# Patient Record
Sex: Female | Born: 1985 | Race: White | Hispanic: No | Marital: Married | Smoking: Former smoker
Health system: Southern US, Community
[De-identification: ages and names within clinical notes are randomized; demographics above are authoritative.]

## PROBLEM LIST (undated history)

## (undated) DIAGNOSIS — M1990 Unspecified osteoarthritis, unspecified site: Secondary | ICD-10-CM

## (undated) DIAGNOSIS — Z8669 Personal history of other diseases of the nervous system and sense organs: Secondary | ICD-10-CM

## (undated) DIAGNOSIS — Z8619 Personal history of other infectious and parasitic diseases: Secondary | ICD-10-CM

## (undated) DIAGNOSIS — R002 Palpitations: Secondary | ICD-10-CM

## (undated) DIAGNOSIS — T7840XA Allergy, unspecified, initial encounter: Secondary | ICD-10-CM

## (undated) DIAGNOSIS — K219 Gastro-esophageal reflux disease without esophagitis: Secondary | ICD-10-CM

## (undated) HISTORY — DX: Allergy, unspecified, initial encounter: T78.40XA

## (undated) HISTORY — DX: Unspecified osteoarthritis, unspecified site: M19.90

## (undated) HISTORY — DX: Gastro-esophageal reflux disease without esophagitis: K21.9

## (undated) HISTORY — DX: Palpitations: R00.2

## (undated) HISTORY — PX: TONSILLECTOMY: SUR1361

## (undated) HISTORY — DX: Personal history of other infectious and parasitic diseases: Z86.19

## (undated) HISTORY — DX: Personal history of other diseases of the nervous system and sense organs: Z86.69

---

## 2013-05-13 LAB — HM HIV SCREENING LAB: HM HIV Screening: NEGATIVE

## 2013-05-13 LAB — HIV ANTIBODY (ROUTINE TESTING W REFLEX): HIV 1&2 Ab, 4th Generation: NEGATIVE

## 2017-08-13 ENCOUNTER — Ambulatory Visit: Payer: BLUE CROSS/BLUE SHIELD | Admitting: Internal Medicine

## 2017-08-13 VITALS — BP 92/66 | HR 70 | Temp 98.0°F | Ht 64.0 in | Wt 128.8 lb

## 2017-08-13 DIAGNOSIS — Z1159 Encounter for screening for other viral diseases: Secondary | ICD-10-CM | POA: Diagnosis not present

## 2017-08-13 DIAGNOSIS — Z0184 Encounter for antibody response examination: Secondary | ICD-10-CM | POA: Diagnosis not present

## 2017-08-13 DIAGNOSIS — Z1329 Encounter for screening for other suspected endocrine disorder: Secondary | ICD-10-CM

## 2017-08-13 DIAGNOSIS — E559 Vitamin D deficiency, unspecified: Secondary | ICD-10-CM

## 2017-08-13 DIAGNOSIS — Z808 Family history of malignant neoplasm of other organs or systems: Secondary | ICD-10-CM | POA: Diagnosis not present

## 2017-08-13 DIAGNOSIS — Z1389 Encounter for screening for other disorder: Secondary | ICD-10-CM | POA: Diagnosis not present

## 2017-08-13 DIAGNOSIS — Z13220 Encounter for screening for lipoid disorders: Secondary | ICD-10-CM

## 2017-08-13 DIAGNOSIS — G43919 Migraine, unspecified, intractable, without status migrainosus: Secondary | ICD-10-CM | POA: Diagnosis not present

## 2017-08-13 DIAGNOSIS — Z0000 Encounter for general adult medical examination without abnormal findings: Secondary | ICD-10-CM | POA: Diagnosis not present

## 2017-08-13 DIAGNOSIS — Z1283 Encounter for screening for malignant neoplasm of skin: Secondary | ICD-10-CM | POA: Diagnosis not present

## 2017-08-13 DIAGNOSIS — J309 Allergic rhinitis, unspecified: Secondary | ICD-10-CM

## 2017-08-13 DIAGNOSIS — G43909 Migraine, unspecified, not intractable, without status migrainosus: Secondary | ICD-10-CM

## 2017-08-13 MED ORDER — BUTALBITAL-APAP-CAFFEINE 50-300-40 MG PO CAPS: 1.0000 | ORAL_CAPSULE | ORAL | 3 refills | Status: DC | PRN

## 2017-08-13 MED ORDER — PROMETHAZINE HCL 25 MG PO TABS: 25.0000 mg | ORAL_TABLET | Freq: Two times a day (BID) | ORAL | 2 refills | Status: DC | PRN

## 2017-08-13 MED ORDER — SUMATRIPTAN SUCCINATE 50 MG PO TABS: 50.0000 mg | ORAL_TABLET | ORAL | 2 refills | Status: DC | PRN

## 2017-08-13 NOTE — Progress Notes (Addendum)
Chief Complaint  Patient presents with  . New Patient (Initial Visit)   NP former Dr. Lorin PicketScott pt years ago  1. C/o migraines drinking 1-2 cups of coffee per day and soda prn sleeping 8 hours on migraine medications which help at times but 2 weeks ago had vomiting in middle of night 2/2 h/a and had to go to ED/UC 2 imitrex did not help at that time had toradol and phenerghan at the clinic. This has happened I.e bad h/a 2x w/in the last 3 years.  2. FH skin cancer would like to see dermatology  3. H/o palpitations though no longer having them and never saw cardiology in the past.    Review of Systems  Constitutional: Negative for weight loss.  HENT: Negative for hearing loss.   Eyes: Negative for blurred vision.  Respiratory: Negative for shortness of breath.   Cardiovascular: Negative for chest pain and palpitations.  Gastrointestinal: Negative for abdominal pain.  Musculoskeletal: Negative for falls.  Skin: Negative for rash.  Neurological: Negative for headaches.  Psychiatric/Behavioral: Negative for depression.   Past Medical History:  Diagnosis Date  . Allergy   . Arthritis   . GERD (gastroesophageal reflux disease)   . Heart palpitations    years ago thought 2/2 stress did not see cardiology was on amitriptyline per pt   . History of chicken pox   . Hx of migraines    Past Surgical History:  Procedure Laterality Date  . TONSILLECTOMY     2001   Family History  Problem Relation Age of Onset  . Depression Mother   . Early death Mother        1561  . Heart disease Mother        MVP s/p repair and valve infected   . Learning disabilities Mother   . Stroke Mother   . Cancer Mother        skin  . Alcohol abuse Father   . Hyperlipidemia Father   . Hypertension Father   . Learning disabilities Father   . Learning disabilities Sister   . Depression Sister   . Cancer Maternal Aunt        skin  . Cancer Maternal Uncle        skin  . COPD Maternal Grandmother   .  Diabetes Maternal Grandmother   . Early death Maternal Grandfather   . Heart disease Maternal Grandfather        MI age 34   . Alcohol abuse Paternal Grandmother   . Cancer Paternal Grandmother        throat   . COPD Paternal Grandmother   . Early death Paternal Grandmother   . Alcohol abuse Paternal Grandfather   . COPD Paternal Grandfather    Social History   Socioeconomic History  . Marital status: Married    Spouse name: Not on file  . Number of children: Not on file  . Years of education: Not on file  . Highest education level: Not on file  Occupational History  . Not on file  Social Needs  . Financial resource strain: Not on file  . Food insecurity:    Worry: Not on file    Inability: Not on file  . Transportation needs:    Medical: Not on file    Non-medical: Not on file  Tobacco Use  . Smoking status: Former Games developermoker  . Smokeless tobacco: Never Used  . Tobacco comment: smoked x 10 years on and off 5 cig per  day   Substance and Sexual Activity  . Alcohol use: Yes    Comment: rare   . Drug use: Not Currently  . Sexual activity: Yes  Lifestyle  . Physical activity:    Days per week: Not on file    Minutes per session: Not on file  . Stress: Not on file  Relationships  . Social connections:    Talks on phone: Not on file    Gets together: Not on file    Attends religious service: Not on file    Active member of club or organization: Not on file    Attends meetings of clubs or organizations: Not on file    Relationship status: Not on file  . Intimate partner violence:    Fear of current or ex partner: Not on file    Emotionally abused: Not on file    Physically abused: Not on file    Forced sexual activity: Not on file  Other Topics Concern  . 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 Butalbital-APAP-Caffeine  (FIORICET) 50-300-40 MG CAPS Take 1-2 tablets by mouth every 4 (four) hours as needed.  Marland Kitchen levonorgestrel (MIRENA) 20 MCG/24HR IUD 1 each by Intrauterine route once.  . SUMAtriptan (IMITREX) 50 MG tablet Take 1 tablet (50 mg total) by mouth every 2 (two) hours as needed for migraine. May repeat in 2 hours if headache persists or recurs. No more than 2x per week or 200 mg in 24 hours  . [DISCONTINUED] Butalbital-APAP-Caffeine (FIORICET) 50-300-40 MG CAPS Take by mouth.  . [DISCONTINUED] Promethazine HCl (PHENERGAN PO) Take by mouth.  . [DISCONTINUED] SUMAtriptan (IMITREX) 50 MG tablet   . [DISCONTINUED] SUMAtriptan (IMITREX) 50 MG tablet Take 50 mg by mouth every 2 (two) hours as needed for migraine. May repeat in 2 hours if headache persists or recurs.   Allergies  Allergen Reactions  . Other     Dogs, cats, dust mites and mold     No results found for this or any previous visit (from the past 2160 hour(s)). Objective  Body mass index is 22.11 kg/m. Wt Readings from Last 3 Encounters:  08/13/17 128 lb 12.8 oz (58.4 kg)   Temp Readings from Last 3 Encounters:  08/13/17 98 F (36.7 C) (Oral)   BP Readings from Last 3 Encounters:  08/13/17 92/66   Pulse Readings from Last 3 Encounters:  08/13/17 70    Physical Exam  Constitutional: She is oriented to person, place, and time. Vital signs are normal. She appears well-developed and well-nourished. She is cooperative.  HENT:  Head: Normocephalic and atraumatic.  Mouth/Throat: Oropharynx is clear and moist and mucous membranes are normal.  Eyes: Pupils are equal, round, and reactive to light. Conjunctivae are normal.  Cardiovascular: Normal rate, regular rhythm and normal heart sounds.  Pulmonary/Chest: Effort normal and breath sounds normal.  Neurological: She is alert and oriented to person, place, and time. Gait normal.  Skin: Skin is warm, dry and intact.  Psychiatric: She has a normal mood and affect. Her speech is normal and  behavior is normal. Judgment and thought content normal. Cognition and memory are normal.  Nursing note and vitals reviewed.   Assessment   1. Migraine  Saw Dr. Sherryll Burger 11/10/17 start mag 400-600 mg qd  2. FH skin cancer (mom, uncle and aunt MM) 3. Allergic rhinitis/allergies  4. HM Plan  1. Refilled meds  Referred to neurology Dr. Sherryll Burger further f/u  -rec mag, fioricet 50-300-40 prn, imitrex 50-100 mg rec CMET, CBC, TSH, B12 and vitamin D with PCP rec melatonin 3 mg qhs for insomnia f/ui in 6 months   2. Referred to dermatology Dr. Roseanne Kaufman for tbse  3. rec use Zyrtec, flonase otc due to ears popping consider singulair in future used to be on allergy shots hold for now allergic to dogs, cats, dust mites and mold  4.  Last flu shot 2017  Tdap per pt 2010 though not in [facility]   resch fasting labs  HIV neg 05/13/13   Former smoker  Pap get records [facility]  Has Mirena IUD   Eye MD [facility]     Provider: Dr. French Ana McLean-Scocuzza-[facility]

## 2017-08-13 NOTE — Progress Notes (Signed)
Pre visit review using our clinic review tool, if applicable. No additional management support is needed unless otherwise documented below in the visit note. 

## 2017-08-13 NOTE — Patient Instructions (Addendum)
Consider singulair for allergies  F/u in 3 months  sch fasting labs as soon as possible  Try Flonase and Zyrtec     Migraine Headache A migraine headache is an intense, throbbing pain on one side or both sides of the head. Migraines may also cause other symptoms, such as nausea, vomiting, and sensitivity to light and noise. What are the causes? Doing or taking certain things may also trigger migraines, such as:  Alcohol.  Smoking.  Medicines, such as: ? Medicine used to treat chest pain (nitroglycerine). ? Birth control pills. ? Estrogen pills. ? Certain blood pressure medicines.  Aged cheeses, chocolate, or caffeine.  Foods or drinks that contain nitrates, glutamate, aspartame, or tyramine.  Physical activity.  Other things that may trigger a migraine include:  Menstruation.  Pregnancy.  Hunger.  Stress, lack of sleep, too much sleep, or fatigue.  Weather changes.  What increases the risk? The following factors may make you more likely to experience migraine headaches:  Age. Risk increases with age.  Family history of migraine headaches.  Being Caucasian.  Depression and anxiety.  Obesity.  Being a woman.  Having a hole in the heart (patent foramen ovale) or other heart problems.  What are the signs or symptoms? The main symptom of this condition is pulsating or throbbing pain. Pain may:  Happen in any area of the head, such as on one side or both sides.  Interfere with daily activities.  Get worse with physical activity.  Get worse with exposure to bright lights or loud noises.  Other symptoms may include:  Nausea.  Vomiting.  Dizziness.  General sensitivity to bright lights, loud noises, or smells.  Before you get a migraine, you may get warning signs that a migraine is developing (aura). An aura may include:  Seeing flashing lights or having blind spots.  Seeing bright spots, halos, or zigzag lines.  Having tunnel vision or  blurred vision.  Having numbness or a tingling feeling.  Having trouble talking.  Having muscle weakness.  How is this diagnosed? A migraine headache can be diagnosed based on:  Your symptoms.  A physical exam.  Tests, such as CT scan or MRI of the head. These imaging tests can help rule out other causes of headaches.  Taking fluid from the spine (lumbar puncture) and analyzing it (cerebrospinal fluid analysis, or CSF analysis).  How is this treated? A migraine headache is usually treated with medicines that:  Relieve pain.  Relieve nausea.  Prevent migraines from coming back.  Treatment may also include:  Acupuncture.  Lifestyle changes like avoiding foods that trigger migraines.  Follow these instructions at home: Medicines  Take over-the-counter and prescription medicines only as told by your health care provider.  Do not drive or use heavy machinery while taking prescription pain medicine.  To prevent or treat constipation while you are taking prescription pain medicine, your health care provider may recommend that you: ? Drink enough fluid to keep your urine clear or pale yellow. ? Take over-the-counter or prescription medicines. ? Eat foods that are high in fiber, such as fresh fruits and vegetables, whole grains, and beans. ? Limit foods that are high in fat and processed sugars, such as fried and sweet foods. Lifestyle  Avoid alcohol use.  Do not use any products that contain nicotine or tobacco, such as cigarettes and e-cigarettes. If you need help quitting, ask your health care provider.  Get at least 8 hours of sleep every night.  Limit your  stress. General instructions   Keep a journal to find out what may trigger your migraine headaches. For example, write down: ? What you eat and drink. ? How much sleep you get. ? Any change to your diet or medicines.  If you have a migraine: ? Avoid things that make your symptoms worse, such as bright  lights. ? It may help to lie down in a dark, quiet room. ? Do not drive or use heavy machinery. ? Ask your health care provider what activities are safe for you while you are experiencing symptoms.  Keep all follow-up visits as told by your health care provider. This is important. Contact a health care provider if:  You develop symptoms that are different or more severe than your usual migraine symptoms. Get help right away if:  Your migraine becomes severe.  You have a fever.  You have a stiff neck.  You have vision loss.  Your muscles feel weak or like you cannot control them.  You start to lose your balance often.  You develop trouble walking.  You faint. This information is not intended to replace advice given to you by your health care provider. Make sure you discuss any questions you have with your health care provider. Document Released: 02/05/2005 Document Revised: 08/26/2015 Document Reviewed: 07/25/2015 Elsevier Interactive Patient Education  2017 Elsevier Inc.    Allergies, Adult An allergy is when your body's defense system (immune system) overreacts to an otherwise harmless substance (allergen) that you breathe in or eat or something that touches your skin. When you come into contact with something that you are allergic to, your immune system produces certain proteins (antibodies). These proteins cause cells to release chemicals (histamines) that trigger the symptoms of an allergic reaction. Allergies often affect the nasal passages (allergic rhinitis), eyes (allergic conjunctivitis), skin (atopic dermatitis), and stomach. Allergies can be mild or severe. Allergies cannot spread from person to person (are not contagious). They can develop at any age and may be outgrown. What increases the risk? You may be at greater risk of allergies if other people in your family have allergies. What are the signs or symptoms? Symptoms depend on what type of allergy you have. They  may include:  Runny, stuffy nose.  Sneezing.  Itchy mouth, ears, or throat.  Postnasal drip.  Sore throat.  Itchy, red, watery, or puffy eyes.  Skin rash or hives.  Stomach pain.  Vomiting.  Diarrhea.  Bloating.  Wheezing or coughing.  People with a severe allergy to food, medicine, or an insect bite may have a life-threatening allergic reaction (anaphylaxis). Symptoms of anaphylaxis include:  Hives.  Itching.  Flushed face.  Swollen lips, tongue, or mouth.  Tight or swollen throat.  Chest pain or tightness in the chest.  Trouble breathing or shortness of breath.  Rapid heartbeat.  Dizziness or fainting.  Vomiting.  Diarrhea.  Pain in the abdomen.  How is this diagnosed? This condition is diagnosed based on:  Your symptoms.  Your family and medical history.  A physical exam.  You may need to see a health care provider who specializes in treating allergies (allergist). You may also have tests, including:  Skin tests to see which allergens are causing your symptoms, such as: ? Skin prick test. In this test, your skin is pricked with a tiny needle and exposed to small amounts of possible allergens to see if your skin reacts. ? Intradermal skin test. In this test, a small amount of allergen is injected  under your skin to see if your skin reacts. ? Patch test. In this test, a small amount of allergen is placed on your skin and then your skin is covered with a bandage. Your health care provider will check your skin after a couple of days to see if a rash has developed.  Blood tests.  Challenges tests. In this test, you inhale a small amount of allergen by mouth to see if you have an allergic reaction.  You may also be asked to:  Keep a food diary. A food diary is a record of all the foods and drinks you have in a day and any symptoms you experience.  Practice an elimination diet. An elimination diet involves eliminating specific foods from your  diet and then adding them back in one by one to find out if a certain food causes an allergic reaction.  How is this treated? Treatment for allergies depends on your symptoms. Treatment may include:  Cold compresses to soothe itching and swelling.  Eye drops.  Nasal sprays.  Using a saline spray or container (neti pot) to flush out the nose (nasal irrigation). These methods can help clear away mucus and keep the nasal passages moist.  Using a humidifier.  Oral antihistamines or other medicines to block allergic reaction and inflammation.  Skin creams to treat rashes or itching.  Diet changes to eliminate food allergy triggers.  Repeated exposure to tiny amounts of allergens to build up a tolerance and prevent future allergic reactions (immunotherapy). These include: ? Allergy shots. ? Oral treatment. This involves taking small doses of an allergen under the tongue (sublingual immunotherapy).  Emergency epinephrine injection (auto-injector) in case of an allergic emergency. This is a self-injectable, pre-measured medicine that must be given within the first few minutes of a serious allergic reaction.  Follow these instructions at home:  Avoid known allergens whenever possible.  If you suffer from airborne allergens, wash out your nose daily. You can do this with a saline spray or a neti pot to flush out your nose (nasal irrigation).  Take over-the-counter and prescription medicines only as told by your health care provider.  Keep all follow-up visits as told by your health care provider. This is important.  If you are at risk of a severe allergic reaction (anaphylaxis), keep your auto-injector with you at all times.  If you have ever had anaphylaxis, wear a medical alert bracelet or necklace that states you have a severe allergy. Contact a health care provider if:  Your symptoms do not improve with treatment. Get help right away if:  You have symptoms of anaphylaxis, such  as: ? Swollen mouth, tongue, or throat. ? Pain or tightness in your chest. ? Trouble breathing or shortness of breath. ? Dizziness or fainting. ? Severe abdominal pain, vomiting, or diarrhea. This information is not intended to replace advice given to you by your health care provider. Make sure you discuss any questions you have with your health care provider. Document Released: 05/01/2002 Document Revised: 06/06/2016 Document Reviewed: 08/24/2015 Elsevier Interactive Patient Education  2018 [employer].  Montelukast oral tablets What is this medicine? MONTELUKAST (mon te LOO kast) is used to prevent and treat the symptoms of asthma. It is also used to treat allergies. Do not use for an acute asthma attack. This medicine may be used for other purposes; ask your health care provider or pharmacist if you have questions. COMMON BRAND NAME(S): Singulair What should I tell my health care provider before I  take this medicine? They need to know if you have any of these conditions: -liver disease -an unusual or allergic reaction to montelukast, other medicines, foods, dyes, or preservatives -pregnant or trying to get pregnant -breast-feeding How should I use this medicine? This medicine should be given by mouth. Follow the directions on the prescription label. Take this medicine at the same time every day. You may take this medicine with or without meals. Do not chew the tablets. Do not stop taking your medicine unless your doctor tells you to. Talk to your pediatrician regarding the use of this medicine in children. Special care may be needed. While this drug may be prescribed for children as young as 34 years of age for selected conditions, precautions do apply. Overdosage: If you think you have taken too much of this medicine contact a poison control center or emergency room at once. NOTE: This medicine is only for you. Do not share this medicine with others. What if I miss a dose? If you  miss a dose, take it as soon as you can. If it is almost time for your next dose, take only that dose. Do not take double or extra doses. What may interact with this medicine? -anti-infectives like rifampin and rifabutin -medicines for diabetes like rosiglitazone and repaglinide -medicines for seizures like phenytoin, phenobarbital, and carbamazepine -paclitaxel This list may not describe all possible interactions. Give your health care provider a list of all the medicines, herbs, non-prescription drugs, or dietary supplements you use. Also tell them if you smoke, drink alcohol, or use illegal drugs. Some items may interact with your medicine. What should I watch for while using this medicine? Visit your doctor or health care professional for regular checks on your progress. Tell your doctor or health care professional if your allergy or asthma symptoms do not improve. Take your medicine even when you do not have symptoms. Do not stop taking any of your medicine(s) unless your doctor tells you to. If you have asthma, talk to your doctor about what to do in an acute asthma attack. Always have your inhaled rescue medicine for asthma attacks with you. Patients and their families should watch for new or worsening thoughts of suicide or depression. Also watch for sudden changes in feelings such as feeling anxious, agitated, panicky, irritable, hostile, aggressive, impulsive, severely restless, overly excited and hyperactive, or not being able to sleep. Any worsening of mood or thoughts of suicide or dying should be reported to your health care professional right away. What side effects may I notice from receiving this medicine? Side effects that you should report to your doctor or health care professional as soon as possible: -allergic reactions like skin rash or hives, or swelling of the face, lips, or tongue -breathing problems -confusion -dark urine -fever or infection -flu-like  symptoms -hallucinations -painful lumps under the skin -pain, tingling, numbness in the hands or feet -sinus pain or swelling -suicidal thoughts or other mood changes -tremors -trouble sleeping -uncontrolled muscle movements -unusual bleeding or bruising -yellowing of the eyes or skin Side effects that usually do not require medical attention (report to your doctor or health care professional if they continue or are bothersome): -cough -dizziness -drowsiness -headache -nightmares -stomach upset -stuffy nose This list may not describe all possible side effects. Call your doctor for medical advice about side effects. You may report side effects to FDA at 1-800-FDA-1088. Where should I keep my medicine? Keep out of the reach of children. Store at room temperature  between 15 and 30 degrees C (59 and 86 degrees F). Protect from light and moisture. Keep this medicine in the original bottle. Throw away any unused medicine after the expiration date. NOTE: This sheet is a summary. It may not cover all possible information. If you have questions about this medicine, talk to your doctor, pharmacist, or health care provider.  2018 Elsevier/Gold Standard (2015-02-07 09:40:44)

## 2017-08-14 ENCOUNTER — Encounter: Payer: Self-pay | Admitting: Internal Medicine

## 2017-08-27 ENCOUNTER — Other Ambulatory Visit: Payer: BLUE CROSS/BLUE SHIELD

## 2017-11-14 ENCOUNTER — Ambulatory Visit: Payer: BLUE CROSS/BLUE SHIELD | Admitting: Internal Medicine

## 2018-03-03 ENCOUNTER — Telehealth: Payer: Self-pay

## 2018-03-03 NOTE — Telephone Encounter (Signed)
Copied from CRM [id number]. Topic: Appointment Scheduling - Prior Auth Required for Appointment >> Mar 03, 2018 10:42 AM Jolayne Haines L wrote: Patient called to reschedule her lab appt that she missed in July. The orders have expired and she would like to have those active so she can have her labs done before her appointment on 1/30 with Dr Shirlee Latch. Please Advise

## 2018-03-05 ENCOUNTER — Other Ambulatory Visit: Payer: Self-pay | Admitting: Internal Medicine

## 2018-03-05 DIAGNOSIS — Z1389 Encounter for screening for other disorder: Secondary | ICD-10-CM

## 2018-03-05 DIAGNOSIS — Z13220 Encounter for screening for lipoid disorders: Secondary | ICD-10-CM

## 2018-03-05 DIAGNOSIS — Z1159 Encounter for screening for other viral diseases: Secondary | ICD-10-CM

## 2018-03-05 DIAGNOSIS — Z0184 Encounter for antibody response examination: Secondary | ICD-10-CM

## 2018-03-05 DIAGNOSIS — Z0000 Encounter for general adult medical examination without abnormal findings: Secondary | ICD-10-CM

## 2018-03-05 DIAGNOSIS — E559 Vitamin D deficiency, unspecified: Secondary | ICD-10-CM

## 2018-03-05 DIAGNOSIS — Z1329 Encounter for screening for other suspected endocrine disorder: Secondary | ICD-10-CM

## 2018-03-05 NOTE — Telephone Encounter (Signed)
Fasting lab appointment has been made

## 2018-03-05 NOTE — Telephone Encounter (Signed)
Call and sch fasting labs before appt   TMS

## 2018-03-14 ENCOUNTER — Other Ambulatory Visit (INDEPENDENT_AMBULATORY_CARE_PROVIDER_SITE_OTHER): Payer: BLUE CROSS/BLUE SHIELD

## 2018-03-14 DIAGNOSIS — E559 Vitamin D deficiency, unspecified: Secondary | ICD-10-CM | POA: Diagnosis not present

## 2018-03-14 DIAGNOSIS — Z13220 Encounter for screening for lipoid disorders: Secondary | ICD-10-CM | POA: Diagnosis not present

## 2018-03-14 DIAGNOSIS — Z1389 Encounter for screening for other disorder: Secondary | ICD-10-CM

## 2018-03-14 DIAGNOSIS — Z1329 Encounter for screening for other suspected endocrine disorder: Secondary | ICD-10-CM

## 2018-03-14 DIAGNOSIS — Z1159 Encounter for screening for other viral diseases: Secondary | ICD-10-CM

## 2018-03-14 DIAGNOSIS — Z0184 Encounter for antibody response examination: Secondary | ICD-10-CM

## 2018-03-14 DIAGNOSIS — Z0000 Encounter for general adult medical examination without abnormal findings: Secondary | ICD-10-CM

## 2018-03-14 LAB — CBC WITH DIFFERENTIAL/PLATELET
BASOS ABS: 0 10*3/uL (ref 0.0–0.1)
Basophils Relative: 0.6 % (ref 0.0–3.0)
EOS ABS: 0.3 10*3/uL (ref 0.0–0.7)
Eosinophils Relative: 4.2 % (ref 0.0–5.0)
HCT: 40.2 % (ref 36.0–46.0)
Hemoglobin: 13.7 g/dL (ref 12.0–15.0)
Lymphocytes Relative: 33.5 % (ref 12.0–46.0)
Lymphs Abs: 2.2 10*3/uL (ref 0.7–4.0)
MCHC: 34 g/dL (ref 30.0–36.0)
MCV: 93.9 fl (ref 78.0–100.0)
Monocytes Absolute: 0.5 10*3/uL (ref 0.1–1.0)
Monocytes Relative: 7.4 % (ref 3.0–12.0)
Neutro Abs: 3.6 10*3/uL (ref 1.4–7.7)
Neutrophils Relative %: 54.3 % (ref 43.0–77.0)
Platelets: 234 10*3/uL (ref 150.0–400.0)
RBC: 4.29 Mil/uL (ref 3.87–5.11)
RDW: 13.2 % (ref 11.5–15.5)
WBC: 6.7 10*3/uL (ref 4.0–10.5)

## 2018-03-14 LAB — COMPREHENSIVE METABOLIC PANEL
ALT: 12 U/L (ref 0–35)
AST: 16 U/L (ref 0–37)
Albumin: 4.4 g/dL (ref 3.5–5.2)
Alkaline Phosphatase: 49 U/L (ref 39–117)
BUN: 10 mg/dL (ref 6–23)
CO2: 27 mEq/L (ref 19–32)
Calcium: 9.1 mg/dL (ref 8.4–10.5)
Chloride: 104 mEq/L (ref 96–112)
Creatinine, Ser: 0.77 mg/dL (ref 0.40–1.20)
GFR: 85.45 mL/min (ref >60.00)
Glucose, Bld: 95 mg/dL (ref 70–99)
Potassium: 4 mEq/L (ref 3.5–5.1)
Sodium: 139 mEq/L (ref 135–145)
Total Bilirubin: 0.7 mg/dL (ref 0.2–1.2)
Total Protein: 6.6 g/dL (ref 6.0–8.3)

## 2018-03-14 LAB — T4, FREE: Free T4: 0.92 ng/dL (ref 0.60–1.60)

## 2018-03-14 LAB — LIPID PANEL
Cholesterol: 174 mg/dL (ref 0–200)
HDL: 72.5 mg/dL (ref >39.00)
LDL CALC: 94 mg/dL (ref 0–99)
NonHDL: 101.2
Total CHOL/HDL Ratio: 2
Triglycerides: 37 mg/dL (ref 0.0–149.0)
VLDL: 7.4 mg/dL (ref 0.0–40.0)

## 2018-03-14 LAB — VITAMIN D 25 HYDROXY (VIT D DEFICIENCY, FRACTURES): VITD: 32.71 ng/mL (ref 30.00–100.00)

## 2018-03-14 LAB — TSH: TSH: 1.22 u[IU]/mL (ref 0.35–4.50)

## 2018-03-14 NOTE — Addendum Note (Signed)
Addended by: Penne Lash on: 03/14/2018 09:23 AM   Modules accepted: Orders

## 2018-03-15 LAB — URINALYSIS, ROUTINE W REFLEX MICROSCOPIC
BILIRUBIN UA: NEGATIVE
Glucose, UA: NEGATIVE
Ketones, UA: NEGATIVE
Leukocytes, UA: NEGATIVE
NITRITE UA: NEGATIVE
Protein, UA: NEGATIVE
RBC, UA: NEGATIVE
Specific Gravity, UA: 1.015 (ref 1.005–1.030)
Urobilinogen, Ur: 0.2 mg/dL (ref 0.2–1.0)
pH, UA: 8.5 — ABNORMAL HIGH (ref 5.0–7.5)

## 2018-03-17 ENCOUNTER — Other Ambulatory Visit: Payer: Self-pay | Admitting: Internal Medicine

## 2018-03-17 DIAGNOSIS — N898 Other specified noninflammatory disorders of vagina: Secondary | ICD-10-CM

## 2018-03-17 LAB — HEPATITIS B SURFACE ANTIBODY, QUANTITATIVE: Hep B S AB Quant (Post): 141 m[IU]/mL (ref >=10)

## 2018-03-17 LAB — MEASLES/MUMPS/RUBELLA IMMUNITY
Mumps IgG: 72.1 AU/mL
Rubella: 4.61 index
Rubeola IgG: 37.4 AU/mL

## 2018-03-17 NOTE — Progress Notes (Signed)
Patient stated she will await appointment. No other sx.

## 2018-03-20 ENCOUNTER — Ambulatory Visit: Payer: BLUE CROSS/BLUE SHIELD | Admitting: Internal Medicine

## 2018-03-20 ENCOUNTER — Encounter: Payer: Self-pay | Admitting: Internal Medicine

## 2018-03-20 VITALS — BP 100/64 | HR 74 | Temp 98.4°F | Ht 64.0 in | Wt 130.2 lb

## 2018-03-20 DIAGNOSIS — N898 Other specified noninflammatory disorders of vagina: Secondary | ICD-10-CM | POA: Diagnosis not present

## 2018-03-20 DIAGNOSIS — Z0000 Encounter for general adult medical examination without abnormal findings: Secondary | ICD-10-CM

## 2018-03-20 DIAGNOSIS — J309 Allergic rhinitis, unspecified: Secondary | ICD-10-CM

## 2018-03-20 MED ORDER — CETIRIZINE HCL 10 MG PO TABS: 10.0000 mg | ORAL_TABLET | Freq: Every day | ORAL | 3 refills | Status: DC | PRN

## 2018-03-20 NOTE — Progress Notes (Signed)
Pre visit review using our clinic review tool, if applicable. No additional management support is needed unless otherwise documented below in the visit note. 

## 2018-03-20 NOTE — Patient Instructions (Addendum)
Magnesium 400-500 mg daily  Vitamin B2 200 mg 2x per day  Vitamin D3 2000 IU daily    Jolene Provost, MD  [id number] [facility] [address]  [facility]  [place], [place] [address]  [phone]  [phone])    Harlin Rain [place], [place]  [address]  [place], [place] [address]  [phone]  [phone] (Fax)     Call neurology to move up appt if headaches uncontrolled

## 2018-03-20 NOTE — Progress Notes (Signed)
Chief Complaint  Patient presents with  . Follow-up   Annual 1. Allergies wants refill of Zyrtec sent to pharmcy  2. C/o vaginal discharge clear and cycles hard to time with variations in amts of bleeding with Mirena in x 1.5 years  3. Reviewed labs  4. Migraines uncontrolled on fiorcet and imitrex 100 mg prn having 6 per month and other days mild h/a appt with neurology Dr. Manuella Ghazi 04/28/2018    Review of Systems  Constitutional: Negative for weight loss.  HENT: Negative for hearing loss.   Eyes: Negative for blurred vision.  Respiratory: Negative for shortness of breath.   Cardiovascular: Negative for chest pain.  Gastrointestinal: Negative for abdominal pain.  Genitourinary:       +vaginal discharge    Musculoskeletal: Negative for falls.  Skin: Negative for rash.  Neurological: Positive for headaches.  Psychiatric/Behavioral: Negative for depression.   Past Medical History:  Diagnosis Date  . Allergy   . Arthritis   . GERD (gastroesophageal reflux disease)   . Heart palpitations    years ago thought 2/2 stress did not see cardiology was on amitriptyline per pt   . History of chicken pox   . Hx of migraines    Past Surgical History:  Procedure Laterality Date  . TONSILLECTOMY     2001   Family History  Problem Relation Age of Onset  . Depression Mother   . Early death Mother        78  . Heart disease Mother        MVP s/p repair and valve infected   . Learning disabilities Mother   . Stroke Mother   . Cancer Mother        skin  . Alcohol abuse Father   . Hyperlipidemia Father   . Hypertension Father   . Learning disabilities Father   . Learning disabilities Sister   . Depression Sister   . Cancer Maternal Aunt        skin  . Cancer Maternal Uncle        skin  . COPD Maternal Grandmother   . Diabetes Maternal Grandmother   . Early death Maternal Grandfather   . Heart disease Maternal Grandfather        MI age 83   . Alcohol abuse Paternal Grandmother    . Cancer Paternal Grandmother        throat   . COPD Paternal Grandmother   . Early death Paternal Grandmother   . Alcohol abuse Paternal Grandfather   . COPD Paternal Grandfather    Social History   Socioeconomic History  . Marital status: Married    Spouse name: Not on file  . Number of children: Not on file  . Years of education: Not on file  . Highest education level: Not on file  Occupational History  . Not on file  Social Needs  . Financial resource strain: Not on file  . Food insecurity:    Worry: Not on file    Inability: Not on file  . Transportation needs:    Medical: Not on file    Non-medical: Not on file  Tobacco Use  . Smoking status: Former Research scientist (life sciences)  . Smokeless tobacco: Never Used  . Tobacco comment: smoked x 10 years on and off 5 cig per day   Substance and Sexual Activity  . Alcohol use: Yes    Comment: rare   . Drug use: Not Currently  . Sexual activity: Yes  Lifestyle  .  Physical activity:    Days per week: Not on file    Minutes per session: Not on file  . Stress: Not on file  Relationships  . Social connections:    Talks on phone: Not on file    Gets together: Not on file    Attends religious service: Not on file    Active member of club or organization: Not on file    Attends meetings of clubs or organizations: Not on file    Relationship status: Not on file  . Intimate partner violence:    Fear of current or ex partner: Not on file    Emotionally abused: Not on file    Physically abused: Not on file    Forced sexual activity: Not on file  Other Topics Concern  . 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 Butalbital-APAP-Caffeine (FIORICET) 50-300-40 MG CAPS Take 1-2 tablets by mouth every 4 (four) hours as needed.  Marland Kitchen levonorgestrel (MIRENA) 20 MCG/24HR IUD 1 each by Intrauterine route once.   . promethazine (PHENERGAN) 25 MG tablet Take 1 tablet (25 mg total) by mouth 2 (two) times daily as needed for nausea or vomiting.  . SUMAtriptan (IMITREX) 100 MG tablet   . [DISCONTINUED] SUMAtriptan (IMITREX) 50 MG tablet Take 1 tablet (50 mg total) by mouth every 2 (two) hours as needed for migraine. May repeat in 2 hours if headache persists or recurs. No more than 2x per week or 200 mg in 24 hours   Allergies  Allergen Reactions  . Other     Dogs, cats, dust mites and mold     Recent Results (from the past 2160 hour(s))  Urinalysis, Routine w reflex microscopic     Status: Abnormal   Collection Time: 03/14/18  9:23 AM  Result Value Ref Range   Specific Gravity, UA 1.015 1.005 - 1.030   pH, UA 8.5 (H) 5.0 - 7.5   Color, UA Yellow Yellow   Appearance Ur Clear Clear   Leukocytes, UA Negative Negative   Protein, UA Negative Negative/Trace   Glucose, UA Negative Negative   Ketones, UA Negative Negative   RBC, UA Negative Negative   Bilirubin, UA Negative Negative   Urobilinogen, Ur 0.2 0.2 - 1.0 mg/dL   Nitrite, UA Negative Negative   Microscopic Examination Comment     Comment: Microscopic not indicated and not performed.  Hepatitis B surface antibody,quantitative     Status: None   Collection Time: 03/14/18  9:23 AM  Result Value Ref Range   Hepatitis B-Post 141 > OR = 10 mIU/mL    Comment: . Patient has immunity to hepatitis B virus. . For additional information, please refer to http://education.[facility].com/faq/FAQ105 (This link is being provided for informational/ educational purposes only).   Measles/Mumps/Rubella Immunity     Status: None   Collection Time: 03/14/18  9:23 AM  Result Value Ref Range   Rubeola IgG 37.40 AU/mL    Comment: AU/mL            Interpretation -----            -------------- <13.50           Negative 13.50-16.49      Equivocal >16.49           Positive .  A positive result indicates that the patient has antibody to measles  virus. It does not differentiate  between an active or past infection. The clinical  diagnosis must be interpreted in conjunction with  clinical signs and symptoms of the patient.    Mumps IgG 72.10 AU/mL    Comment:  AU/mL           Interpretation -------         ---------------- <9.00             Negative 9.00-10.99        Equivocal >10.99            Positive A positive result indicates that the patient has  antibody to mumps virus. It does not differentiate between an  active or past infection. The clinical diagnosis must be interpreted in conjunction with clinical signs and symptoms of the patient. .    Rubella 4.61 index    Comment:     Index            Interpretation     -----            --------------       <0.90            Not consistent with Immunity     0.90-0.99        Equivocal     > or = 1.00      Consistent with Immunity  . The presence of rubella IgG antibody suggests  immunization or past or current infection with rubella virus.   Vitamin D (25 hydroxy)     Status: None   Collection Time: 03/14/18  9:23 AM  Result Value Ref Range   VITD 32.71 30.00 - 100.00 ng/mL  T4, free     Status: None   Collection Time: 03/14/18  9:23 AM  Result Value Ref Range   Free T4 0.92 0.60 - 1.60 ng/dL    Comment: Specimens from patients who are undergoing biotin therapy and /or ingesting biotin supplements may contain high levels of biotin.  The higher biotin concentration in these specimens interferes with this Free T4 assay.  Specimens that contain high levels  of biotin may cause false high results for this Free T4 assay.  Please interpret results in light of the total clinical presentation of the patient.    TSH     Status: None   Collection Time: 03/14/18  9:23 AM  Result Value Ref Range   TSH 1.22 0.35 - 4.50 uIU/mL  Lipid panel     Status: None   Collection Time: 03/14/18  9:23 AM  Result Value Ref Range   Cholesterol 174 0 - 200 mg/dL    Comment: ATP III  Classification       Desirable:  < 200 mg/dL               Borderline High:  200 - 239 mg/dL          High:  > = 240 mg/dL   Triglycerides 37.0 0.0 - 149.0 mg/dL    Comment: Normal:  <150 mg/dLBorderline High:  150 - 199 mg/dL   HDL 72.50 >39.00 mg/dL   VLDL 7.4 0.0 - 40.0 mg/dL   LDL Cholesterol 94 0 - 99 mg/dL   Total CHOL/HDL Ratio 2     Comment:                Men          Women1/2 Average  Risk     3.4          3.3Average Risk          5.0          4.42X Average Risk          9.6          7.13X Average Risk          15.0          11.0                       NonHDL 101.20     Comment: NOTE:  Non-HDL goal should be 30 mg/dL higher than patient's LDL goal (i.e. LDL goal of < 70 mg/dL, would have non-HDL goal of < 100 mg/dL)  CBC w/Diff     Status: None   Collection Time: 03/14/18  9:23 AM  Result Value Ref Range   WBC 6.7 4.0 - 10.5 K/uL   RBC 4.29 3.87 - 5.11 Mil/uL   Hemoglobin 13.7 12.0 - 15.0 g/dL   HCT 40.2 36.0 - 46.0 %   MCV 93.9 78.0 - 100.0 fl   MCHC 34.0 30.0 - 36.0 g/dL   RDW 13.2 11.5 - 15.5 %   Platelets 234.0 150.0 - 400.0 K/uL   Neutrophils Relative % 54.3 43.0 - 77.0 %   Lymphocytes Relative 33.5 12.0 - 46.0 %   Monocytes Relative 7.4 3.0 - 12.0 %   Eosinophils Relative 4.2 0.0 - 5.0 %   Basophils Relative 0.6 0.0 - 3.0 %   Neutro Abs 3.6 1.4 - 7.7 K/uL   Lymphs Abs 2.2 0.7 - 4.0 K/uL   Monocytes Absolute 0.5 0.1 - 1.0 K/uL   Eosinophils Absolute 0.3 0.0 - 0.7 K/uL   Basophils Absolute 0.0 0.0 - 0.1 K/uL  Comprehensive metabolic panel     Status: None   Collection Time: 03/14/18  9:23 AM  Result Value Ref Range   Sodium 139 135 - 145 mEq/L   Potassium 4.0 3.5 - 5.1 mEq/L   Chloride 104 96 - 112 mEq/L   CO2 27 19 - 32 mEq/L   Glucose, Bld 95 70 - 99 mg/dL   BUN 10 6 - 23 mg/dL   Creatinine, Ser 0.77 0.40 - 1.20 mg/dL   Total Bilirubin 0.7 0.2 - 1.2 mg/dL   Alkaline Phosphatase 49 39 - 117 U/L   AST 16 0 - 37 U/L   ALT 12 0 - 35 U/L   Total Protein 6.6 6.0 -  8.3 g/dL   Albumin 4.4 3.5 - 5.2 g/dL   Calcium 9.1 8.4 - 10.5 mg/dL   GFR 85.45 >60.00 mL/min   Objective  Body mass index is 22.35 kg/m. Wt Readings from Last 3 Encounters:  03/20/18 130 lb 3.2 oz (59.1 kg)  08/13/17 128 lb 12.8 oz (58.4 kg)   Temp Readings from Last 3 Encounters:  03/20/18 98.4 F (36.9 C) (Oral)  08/13/17 98 F (36.7 C) (Oral)   BP Readings from Last 3 Encounters:  03/20/18 100/64  08/13/17 92/66   Pulse Readings from Last 3 Encounters:  03/20/18 74  08/13/17 70    Physical Exam Vitals signs and nursing note reviewed.  Constitutional:      Appearance: Normal appearance. She is well-developed and well-groomed.  HENT:     Head: Normocephalic and atraumatic.     Nose: Nose normal.     Mouth/Throat:     Mouth: Mucous membranes are  moist.     Pharynx: Oropharynx is clear.  Eyes:     Conjunctiva/sclera: Conjunctivae normal.     Pupils: Pupils are equal, round, and reactive to light.  Cardiovascular:     Rate and Rhythm: Normal rate and regular rhythm.     Heart sounds: Normal heart sounds.  Pulmonary:     Effort: Pulmonary effort is normal.     Breath sounds: Normal breath sounds.  Genitourinary:    Labia:        Right: No rash.        Left: No rash.      Vagina: Normal.     Cervix: Normal.     Uterus: Absent.      Adnexa: Right adnexa normal and left adnexa normal.     Comments: mirena intact  Cervical os with mild blood on cycle   Skin:    General: Skin is warm and dry.  Neurological:     General: No focal deficit present.     Mental Status: She is alert and oriented to person, place, and time. Mental status is at baseline.     Gait: Gait normal.  Psychiatric:        Attention and Perception: Attention and perception normal.        Mood and Affect: Mood and affect normal.        Speech: Speech normal.        Behavior: Behavior normal. Behavior is cooperative.        Thought Content: Thought content normal.        Cognition and  Memory: Cognition and memory normal.        Judgment: Judgment normal.     Assessment   1. Annual  2. Vaginal d/c  3. Migraines  Plan   1.  Last flu shot 2017  Tdap utd 03/21/16 MMR immune  Hep B immune   Reviewed labs  HIV neg 05/13/13   Former smoker  Pap get records [facility] 2nd request Has Mirena IUD since 1.5 years since 03/20/18  Eye MD [facility]  rec healthy diet and exercise  2. Pelvic today with NuSwab r/o BV/yeast  3.  F/u neurology  Disc magnesium 400-500 mg qd and B2 200 mg bid   Refilled zyrtec for allergies  Provider: Dr. Olivia Mackie McLean-Scocuzza-Internal Medicine

## 2018-03-25 ENCOUNTER — Other Ambulatory Visit: Payer: Self-pay | Admitting: Internal Medicine

## 2018-03-25 DIAGNOSIS — B9689 Other specified bacterial agents as the cause of diseases classified elsewhere: Secondary | ICD-10-CM

## 2018-03-25 DIAGNOSIS — N760 Acute vaginitis: Principal | ICD-10-CM

## 2018-03-25 LAB — NUSWAB VG, CANDIDA 6SP
Atopobium vaginae: HIGH Score — AB
C PARAPSILOSIS/TROPICALIS: NEGATIVE
CANDIDA GLABRATA, NAA: NEGATIVE
Candida albicans, NAA: NEGATIVE
Candida krusei, NAA: NEGATIVE
Candida lusitaniae, NAA: NEGATIVE
Megasphaera 1: HIGH Score — AB
TRICH VAG BY NAA: NEGATIVE

## 2018-03-25 MED ORDER — METRONIDAZOLE 500 MG PO TABS: 500.0000 mg | ORAL_TABLET | Freq: Two times a day (BID) | ORAL | 0 refills | Status: DC

## 2018-04-04 ENCOUNTER — Encounter: Payer: Self-pay | Admitting: Family Medicine

## 2018-04-04 ENCOUNTER — Ambulatory Visit: Payer: BLUE CROSS/BLUE SHIELD | Admitting: Family Medicine

## 2018-04-04 VITALS — BP 100/68 | HR 80 | Temp 97.8°F | Resp 16 | Ht 64.0 in | Wt 127.8 lb

## 2018-04-04 DIAGNOSIS — J019 Acute sinusitis, unspecified: Secondary | ICD-10-CM

## 2018-04-04 DIAGNOSIS — J101 Influenza due to other identified influenza virus with other respiratory manifestations: Secondary | ICD-10-CM

## 2018-04-04 LAB — POC INFLUENZA A&B (BINAX/QUICKVUE)
Influenza A, POC: POSITIVE — AB
Influenza B, POC: NEGATIVE

## 2018-04-04 MED ORDER — AMOXICILLIN-POT CLAVULANATE 875-125 MG PO TABS: 1.0000 | ORAL_TABLET | Freq: Two times a day (BID) | ORAL | 0 refills | Status: DC

## 2018-04-04 NOTE — Progress Notes (Signed)
Subjective:    Patient ID: Autumn [name], female    DOB: 1985/08/28, 33 y.o.   MRN: [id number]  HPI   Patient presents to clinic complaining of headache, thick nasal congestion that is yellow in color, feeling achy all over off/on for about 10 days.  Patient states many of her family members have been sick with different symptoms and states both her husband and 1 of her children were diagnosed with the flu.  Patient denies any fever or chills.  Denies nausea/vomiting or diarrhea.  Denies cough, shortness of breath, chest pain or wheezing.  Patient Active Problem List   Diagnosis Date Noted  . Annual physical exam 03/20/2018  . Migraine 08/13/2017  . Family history of skin cancer 08/13/2017  . Allergic rhinitis 08/13/2017   Social History   Tobacco Use  . Smoking status: Former Games developer  . Smokeless tobacco: Never Used  . Tobacco comment: smoked x 10 years on and off 5 cig per day   Substance Use Topics  . Alcohol use: Yes    Comment: rare    Review of Systems  Constitutional: Negative for chills, fatigue and fever.  HENT:+congestion, ear pain, sinus pain/pressure/drainage and sore throat.   Eyes: Negative.   Respiratory: Negative for cough, shortness of breath and wheezing.   Cardiovascular: Negative for chest pain, palpitations and leg swelling.  Gastrointestinal: Negative for abdominal pain, diarrhea, nausea and vomiting.  Genitourinary: Negative for dysuria, frequency and urgency.  Musculoskeletal: generalized aches all over Skin: Negative for color change, pallor and rash.  Neurological: Negative for syncope, light-headedness. +headaches Psychiatric/Behavioral: The patient is not nervous/anxious.       Objective:   Physical Exam Vitals signs and nursing note reviewed.  Constitutional:      General: She is not in acute distress.    Appearance: She is not toxic-appearing or diaphoretic.  HENT:     Head: Normocephalic and atraumatic.     Right Ear: There is no  impacted cerumen.     Left Ear: There is no impacted cerumen.     Nose: Nasal tenderness, congestion and rhinorrhea present.     Right Sinus: Maxillary sinus tenderness and frontal sinus tenderness present.     Left Sinus: Maxillary sinus tenderness and frontal sinus tenderness present.     Comments: +thick yellow nasal discharge and post nasal drainage    Mouth/Throat:     Mouth: Mucous membranes are moist.     Pharynx: No oropharyngeal exudate or posterior oropharyngeal erythema.  Eyes:     General: No scleral icterus.    Extraocular Movements: Extraocular movements intact.     Conjunctiva/sclera: Conjunctivae normal.     Pupils: Pupils are equal, round, and reactive to light.  Neck:     Musculoskeletal: Neck supple. No neck rigidity.  Cardiovascular:     Rate and Rhythm: Normal rate and regular rhythm.  Pulmonary:     Effort: Pulmonary effort is normal. No respiratory distress.     Breath sounds: No wheezing, rhonchi or rales.  Lymphadenopathy:     Cervical: No cervical adenopathy.  Skin:    General: Skin is warm and dry.     Coloration: Skin is not pale.  Neurological:     Mental Status: She is alert and oriented to person, place, and time.  Psychiatric:        Mood and Affect: Mood normal.        Behavior: Behavior normal.     Vitals:   04/04/18 1629  BP:  100/68  Pulse: 80  Resp: 16  Temp: 97.8 F (36.6 C)  SpO2: 98%      Assessment & Plan:   Acute sinusitis/influenza A - patient's point-of-care swab testing is positive for influenza A.  Patient cannot pinpoint exact start of the overall achy symptoms, states they have waxed and waned throughout the last 7 to 10 days.  Due to her physical exam and symptoms be concerning for sinusitis we will treat with Augmentin twice daily for 10 days.  Advised to do saline nasal rinses to reduce nasal congestion, get plenty of rest, alternate Tylenol and Motrin as needed for aches, drink plenty fluids and do good handwashing.   Also advised to take an oral probiotic alongside the antibiotic to help replace good gut bacteria.  Patient will keep regularly scheduled follow-up with PCP as planned.  Advised to return to clinic sooner if any issues arise.

## 2018-04-04 NOTE — Patient Instructions (Signed)
Take probiotic daily for next 30 days at least due to taking antibiotic  Sinusitis, Adult Sinusitis is soreness and swelling (inflammation) of your sinuses. Sinuses are hollow spaces in the bones around your face. They are located:  Around your eyes.  In the middle of your forehead.  Behind your nose.  In your cheekbones. Your sinuses and nasal passages are lined with a fluid called mucus. Mucus drains out of your sinuses. Swelling can trap mucus in your sinuses. This lets germs (bacteria, virus, or fungus) grow, which leads to infection. Most of the time, this condition is caused by a virus. What are the causes? This condition is caused by:  Allergies.  Asthma.  Germs.  Things that block your nose or sinuses.  Growths in the nose (nasal polyps).  Chemicals or irritants in the air.  Fungus (rare). What increases the risk? You are more likely to develop this condition if:  You have a weak body defense system (immune system).  You do a lot of swimming or diving.  You use nasal sprays too much.  You smoke. What are the signs or symptoms? The main symptoms of this condition are pain and a feeling of pressure around the sinuses. Other symptoms include:  Stuffy nose (congestion).  Runny nose (drainage).  Swelling and warmth in the sinuses.  Headache.  Toothache.  A cough that may get worse at night.  Mucus that collects in the throat or the back of the nose (postnasal drip).  Being unable to smell and taste.  Being very tired (fatigue).  A fever.  Sore throat.  Bad breath. How is this diagnosed? This condition is diagnosed based on:  Your symptoms.  Your medical history.  A physical exam.  Tests to find out if your condition is short-term (acute) or long-term (chronic). Your doctor may: ? Check your nose for growths (polyps). ? Check your sinuses using a tool that has a light (endoscope). ? Check for allergies or germs. ? Do imaging tests,  such as an MRI or CT scan. How is this treated? Treatment for this condition depends on the cause and whether it is short-term or long-term.  If caused by a virus, your symptoms should go away on their own within 10 days. You may be given medicines to relieve symptoms. They include: ? Medicines that shrink swollen tissue in the nose. ? Medicines that treat allergies (antihistamines). ? A spray that treats swelling of the nostrils. ? Rinses that help get rid of thick mucus in your nose (nasal saline washes).  If caused by bacteria, your doctor may wait to see if you will get better without treatment. You may be given antibiotic medicine if you have: ? A very bad infection. ? A weak body defense system.  If caused by growths in the nose, you may need to have surgery. Follow these instructions at home: Medicines  Take, use, or apply over-the-counter and prescription medicines only as told by your doctor. These may include nasal sprays.  If you were prescribed an antibiotic medicine, take it as told by your doctor. Do not stop taking the antibiotic even if you start to feel better. Hydrate and humidify   Drink enough water to keep your pee (urine) pale yellow.  Use a cool mist humidifier to keep the humidity level in your home above 50%.  Breathe in steam for 10-15 minutes, 3-4 times a day, or as told by your doctor. You can do this in the bathroom while a hot  shower is running.  Try not to spend time in cool or dry air. Rest  Rest as much as you can.  Sleep with your head raised (elevated).  Make sure you get enough sleep each night. General instructions   Put a warm, moist washcloth on your face 3-4 times a day, or as often as told by your doctor. This will help with discomfort.  Wash your hands often with soap and water. If there is no soap and water, use hand sanitizer.  Do not smoke. Avoid being around people who are smoking (secondhand smoke).  Keep all follow-up  visits as told by your doctor. This is important. Contact a doctor if:  You have a fever.  Your symptoms get worse.  Your symptoms do not get better within 10 days. Get help right away if:  You have a very bad headache.  You cannot stop throwing up (vomiting).  You have very bad pain or swelling around your face or eyes.  You have trouble seeing.  You feel confused.  Your neck is stiff.  You have trouble breathing. Summary  Sinusitis is swelling of your sinuses. Sinuses are hollow spaces in the bones around your face.  This condition is caused by tissues in your nose that become inflamed or swollen. This traps germs. These can lead to infection.  If you were prescribed an antibiotic medicine, take it as told by your doctor. Do not stop taking it even if you start to feel better.  Keep all follow-up visits as told by your doctor. This is important. This information is not intended to replace advice given to you by your health care provider. Make sure you discuss any questions you have with your health care provider. Document Released: 07/25/2007 Document Revised: 07/08/2017 Document Reviewed: 07/08/2017 Elsevier Interactive Patient Education  2019 [employer].

## 2018-05-01 ENCOUNTER — Other Ambulatory Visit: Payer: Self-pay

## 2018-05-01 ENCOUNTER — Encounter: Payer: Self-pay | Admitting: Family Medicine

## 2018-05-01 ENCOUNTER — Ambulatory Visit (INDEPENDENT_AMBULATORY_CARE_PROVIDER_SITE_OTHER): Payer: BLUE CROSS/BLUE SHIELD | Admitting: Family Medicine

## 2018-05-01 VITALS — BP 98/66 | HR 93 | Temp 98.4°F | Resp 16 | Ht 64.0 in | Wt 129.0 lb

## 2018-05-01 DIAGNOSIS — J0140 Acute pansinusitis, unspecified: Secondary | ICD-10-CM | POA: Diagnosis not present

## 2018-05-01 MED ORDER — DOXYCYCLINE HYCLATE 100 MG PO TABS: 100.0000 mg | ORAL_TABLET | Freq: Two times a day (BID) | ORAL | 0 refills | Status: DC

## 2018-05-01 MED ORDER — FLUTICASONE PROPIONATE 50 MCG/ACT NA SUSP: 2.0000 | Freq: Every day | NASAL | 6 refills | Status: DC

## 2018-05-01 NOTE — Progress Notes (Signed)
Subjective:    Patient ID: Autumn [name], female    DOB: 22-Mar-1985, 33 y.o.   MRN: [id number]  HPI   Patient presents to clinic for continued sinus congestion and pressure.  She had the flu and sinus infection around Valentine's Day 2020.  Patient was treated with course of Augmentin, sinus symptoms did improve for 2 to 3 days, but slowly began to return now been present for almost 2 weeks again.  Denies fever or chills.  Denies body aches.  Denies nausea, vomiting or diarrhea.  Denies wheezing or shortness of breath.  Patient Active Problem List   Diagnosis Date Noted  . Annual physical exam 03/20/2018  . Migraine 08/13/2017  . Family history of skin cancer 08/13/2017  . Allergic rhinitis 08/13/2017   Social History   Tobacco Use  . Smoking status: Former Games developer  . Smokeless tobacco: Never Used  . Tobacco comment: smoked x 10 years on and off 5 cig per day   Substance Use Topics  . Alcohol use: Yes    Comment: rare    Review of Systems  Constitutional: Negative for chills, fatigue and fever.  HENT: +congestion, ear pain, sinus pain and sore throat.   Eyes: Negative.   Respiratory: +cough. Negative for shortness of breath and wheezing.   Cardiovascular: Negative for chest pain, palpitations and leg swelling.  Gastrointestinal: Negative for abdominal pain, diarrhea, nausea and vomiting.  Genitourinary: Negative for dysuria, frequency and urgency.  Musculoskeletal: Negative for arthralgias and myalgias.  Skin: Negative for color change, pallor and rash.  Neurological: Negative for syncope, light-headedness and headaches.  Psychiatric/Behavioral: The patient is not nervous/anxious.       Objective:   Physical Exam Vitals signs and nursing note reviewed.  Constitutional:      General: She is not in acute distress.    Appearance: She is not toxic-appearing or diaphoretic.  HENT:     Head: Normocephalic and atraumatic.     Right Ear: There is no impacted cerumen.   Left Ear: There is no impacted cerumen.     Ears:     Comments: +fullness bilateral TMs    Nose: Nasal tenderness, congestion and rhinorrhea present.     Right Sinus: Maxillary sinus tenderness and frontal sinus tenderness present.     Left Sinus: Maxillary sinus tenderness and frontal sinus tenderness present.     Comments: Thick yellow drainage    Mouth/Throat:     Mouth: Mucous membranes are moist.     Pharynx: No oropharyngeal exudate or posterior oropharyngeal erythema.  Eyes:     General: No scleral icterus.       Right eye: No discharge.        Left eye: No discharge.     Extraocular Movements: Extraocular movements intact.     Conjunctiva/sclera: Conjunctivae normal.     Pupils: Pupils are equal, round, and reactive to light.  Neck:     Musculoskeletal: Neck supple. No neck rigidity.  Cardiovascular:     Rate and Rhythm: Normal rate and regular rhythm.  Pulmonary:     Effort: Pulmonary effort is normal. No respiratory distress.     Breath sounds: No wheezing, rhonchi or rales.  Lymphadenopathy:     Cervical: No cervical adenopathy.  Skin:    General: Skin is warm and dry.     Coloration: Skin is not pale.  Neurological:     Mental Status: She is alert and oriented to person, place, and time.  Psychiatric:  Mood and Affect: Mood normal.        Behavior: Behavior normal.    Vitals:   05/01/18 1021  BP: 98/66  Pulse: 93  Resp: 16  Temp: 98.4 F (36.9 C)  SpO2: 97%      Assessment & Plan:   Acute sinusitis - patient symptoms consistent with a sinus infection.  She finished Augmentin course.  She will take doxycycline twice daily for 10 days, and will continue using Mucinex to calm cough.  She will also add in Flonase nasal spray to reduce congestion.  Advised to increase fluid intake, do good handwashing and get plenty of rest.  Also suggested doing saline nasal rinses.  Patient will keep regularly scheduled follow-up PCP as planned and return to clinic  sooner if any issues arise.

## 2018-10-17 ENCOUNTER — Other Ambulatory Visit: Payer: Self-pay | Admitting: Internal Medicine

## 2018-10-17 DIAGNOSIS — G43909 Migraine, unspecified, not intractable, without status migrainosus: Secondary | ICD-10-CM

## 2018-10-17 MED ORDER — BUTALBITAL-APAP-CAFFEINE 50-300-40 MG PO CAPS: 1.0000 | ORAL_CAPSULE | ORAL | 3 refills | Status: DC | PRN

## 2018-11-27 ENCOUNTER — Ambulatory Visit (INDEPENDENT_AMBULATORY_CARE_PROVIDER_SITE_OTHER): Payer: BLUE CROSS/BLUE SHIELD | Admitting: Internal Medicine

## 2018-11-27 ENCOUNTER — Other Ambulatory Visit: Payer: Self-pay

## 2018-11-27 VITALS — Ht 64.0 in | Wt 134.0 lb

## 2018-11-27 DIAGNOSIS — J321 Chronic frontal sinusitis: Secondary | ICD-10-CM | POA: Diagnosis not present

## 2018-11-27 DIAGNOSIS — G43919 Migraine, unspecified, intractable, without status migrainosus: Secondary | ICD-10-CM

## 2018-11-27 DIAGNOSIS — G8929 Other chronic pain: Secondary | ICD-10-CM

## 2018-11-27 DIAGNOSIS — R519 Headache, unspecified: Secondary | ICD-10-CM | POA: Diagnosis not present

## 2018-11-27 DIAGNOSIS — J309 Allergic rhinitis, unspecified: Secondary | ICD-10-CM | POA: Diagnosis not present

## 2018-11-27 MED ORDER — MONTELUKAST SODIUM 10 MG PO TABS: 10.0000 mg | ORAL_TABLET | Freq: Every day | ORAL | 0 refills | Status: DC

## 2018-11-27 MED ORDER — AZITHROMYCIN 250 MG PO TABS: ORAL_TABLET | ORAL | 0 refills | Status: DC

## 2018-11-27 NOTE — Progress Notes (Signed)
Telephone Note  I connected with Autumn [name]  on 11/27/18 at 11:50 AM EDT by telephone and verified that I am speaking with the correct person using two identifiers.  Location patient: home Location provider:work or home office Persons participating in the virtual visit: patient, provider  I discussed the limitations of evaluation and management by telemedicine and the availability of in person appointments. The patient expressed understanding and agreed to proceed.   HPI: 1. H/o migraines x 3-4 weeks daily migraines except for the last 2-3 days failed imitrex 100 mg prn and fiorocet 50-300-40 did not given relief and she has been taking magnesium 400 -600 mg daily with zinc and melatonin at night. And zyrtec qhs for allergies. She has been sleeping 7 hours and drinking only coffee in the am pain is 7-8/10 max 9-10/10 with nausea w/o vomiting, no dizziness/blurry vision   2. Allergies with nasal sx's c/o nasal congestion and fluid in ears and 1 nostril closes at night while trying to breath. Sinus pressure x 1 week allergy to dust,mold cats and dogs  She tried flonase but makes sx's worse and taking zyrtec qhs    ROS: See pertinent positives and negatives per HPI.  Past Medical History:  Diagnosis Date  . Allergy   . Arthritis   . GERD (gastroesophageal reflux disease)   . Heart palpitations    years ago thought 2/2 stress did not see cardiology was on amitriptyline per pt   . History of chicken pox   . Hx of migraines     Past Surgical History:  Procedure Laterality Date  . TONSILLECTOMY     2001    Family History  Problem Relation Age of Onset  . Depression Mother   . Early death Mother        21  . Heart disease Mother        MVP s/p repair and valve infected   . Learning disabilities Mother   . Stroke Mother   . Cancer Mother        skin  . Alcohol abuse Father   . Hyperlipidemia Father   . Hypertension Father   . Learning disabilities Father   . Learning  disabilities Sister   . Depression Sister   . Cancer Maternal Aunt        skin  . Cancer Maternal Uncle        skin  . COPD Maternal Grandmother   . Diabetes Maternal Grandmother   . Early death Maternal Grandfather   . Heart disease Maternal Grandfather        MI age 88   . Alcohol abuse Paternal Grandmother   . Cancer Paternal Grandmother        throat   . COPD Paternal Grandmother   . Early death Paternal Grandmother   . Alcohol abuse Paternal Grandfather   . 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  azithromycin (ZITHROMAX) 250 MG tablet, 2 pills day 1 and 1 pill day 2-5, Disp: 6 tablet, Rfl: 0 .  Butalbital-APAP-Caffeine (FIORICET) 50-300-40 MG CAPS, Take 1-2 tablets by mouth every 4 (four) hours as needed., Disp: 90 capsule, Rfl: 3 .  cetirizine (ZYRTEC) 10 MG tablet, Take 1 tablet (10 mg total) by mouth daily as needed for allergies., Disp: 90 tablet, Rfl: 3 .  fluticasone (FLONASE) 50 MCG/ACT nasal spray, Place 2 sprays into both nostrils daily., Disp: 16 g, Rfl: 6 .  levonorgestrel (MIRENA) 20 MCG/24HR IUD, 1 each by Intrauterine route once., Disp: , Rfl:  .  montelukast (SINGULAIR) 10 MG tablet, Take 1 tablet (10 mg total) by mouth at bedtime., Disp: 30 tablet, Rfl: 0 .  promethazine (PHENERGAN) 25 MG tablet, Take 1 tablet (25 mg total) by mouth 2 (two) times daily as needed for nausea or vomiting., Disp: 30 tablet, Rfl: 2 .  SUMAtriptan (IMITREX) 100 MG tablet, , Disp: , Rfl:   EXAM:  VITALS per patient if applicable:  GENERAL: alert, oriented, appears well and in no acute distress  PSYCH/NEURO: pleasant and cooperative, no obvious depression or anxiety, speech and thought processing grossly intact  ASSESSMENT AND PLAN:  Discussed the following assessment and plan:  Intractable migraine without status migrainosus,  unspecified migraine type - Plan: Ambulatory referral to Neurology [facility] was seeing Dr. Manuella Ghazi but insurance changed Cont same meds for now imitrex 100 prn and fiorcet Disc other opts tx with neurology  Chronic intractable headache, unspecified headache type - Plan: Ambulatory referral to Neurology, MR Brain Wo Contrast [facility]  Frontal sinusitis, unspecified chronicity - Plan: azithromycin (ZITHROMAX) 250 MG tablet, montelukast (SINGULAIR) 10 MG tablet, cont zyrtec qhs  -if not better will refer to ENT  Allergic rhinitis, unspecified seasonality, unspecified trigger - Plan: montelukast (SINGULAIR) 10 MG tablet with zyrtec, NS and flonase   HM Last flu shot 2017  Tdap utd 03/21/16 MMR immune  Hep B immune   Reviewed labs  HIV neg 05/13/13   Former smoker  Pap get records [facility] 3rd request Has Mirena IUD since 1.5 years since 03/20/18  Eye MD [facility] rec healthy diet and exercise   -we discussed possible serious and likely etiologies, options for evaluation and workup, limitations of telemedicine visit vs in person visit, treatment, treatment risks and precautions. Pt prefers to treat via telemedicine empirically rather then risking or undertaking an in person visit at this moment. Patient agrees to seek prompt in person care if worsening, new symptoms arise, or if is not improving with treatment.   I discussed the assessment and treatment plan with the patient. The patient was provided an opportunity to ask questions and all were answered. The patient agreed with the plan and demonstrated an understanding of the instructions.   The patient was advised to call back or seek an in-person evaluation if the symptoms worsen or if the condition fails to improve as anticipated.  Time spent 25 minutes  Delorise Jackson, MD

## 2018-12-02 ENCOUNTER — Telehealth: Payer: Self-pay | Admitting: Internal Medicine

## 2018-12-02 NOTE — Telephone Encounter (Signed)
I called pt and left pt a vm to call ofc regarding ins and up coming appt.

## 2018-12-04 ENCOUNTER — Encounter: Payer: Self-pay | Admitting: Internal Medicine

## 2019-02-06 ENCOUNTER — Other Ambulatory Visit: Payer: Self-pay | Admitting: Internal Medicine

## 2019-02-06 DIAGNOSIS — J321 Chronic frontal sinusitis: Secondary | ICD-10-CM

## 2019-02-06 DIAGNOSIS — J309 Allergic rhinitis, unspecified: Secondary | ICD-10-CM

## 2019-02-06 MED ORDER — MONTELUKAST SODIUM 10 MG PO TABS: 10.0000 mg | ORAL_TABLET | Freq: Every day | ORAL | 3 refills | Status: DC

## 2019-03-18 ENCOUNTER — Other Ambulatory Visit: Payer: Self-pay

## 2019-03-24 ENCOUNTER — Other Ambulatory Visit (HOSPITAL_COMMUNITY)
Admission: RE | Admit: 2019-03-24 | Discharge: 2019-03-24 | Disposition: A | Discharge status: Home / Self Care | Payer: 59 | Source: Ambulatory Visit | Attending: Internal Medicine | Admitting: Internal Medicine

## 2019-03-24 ENCOUNTER — Telehealth: Payer: Self-pay | Admitting: *Deleted

## 2019-03-24 ENCOUNTER — Ambulatory Visit (INDEPENDENT_AMBULATORY_CARE_PROVIDER_SITE_OTHER): Payer: 59 | Admitting: Internal Medicine

## 2019-03-24 ENCOUNTER — Other Ambulatory Visit: Payer: Self-pay

## 2019-03-24 VITALS — BP 122/84 | HR 82 | Temp 97.0°F | Resp 16 | Ht 64.0 in | Wt 125.0 lb

## 2019-03-24 DIAGNOSIS — G43919 Migraine, unspecified, intractable, without status migrainosus: Secondary | ICD-10-CM | POA: Diagnosis not present

## 2019-03-24 DIAGNOSIS — Z0000 Encounter for general adult medical examination without abnormal findings: Secondary | ICD-10-CM

## 2019-03-24 DIAGNOSIS — Z124 Encounter for screening for malignant neoplasm of cervix: Secondary | ICD-10-CM | POA: Diagnosis not present

## 2019-03-24 DIAGNOSIS — Z1389 Encounter for screening for other disorder: Secondary | ICD-10-CM

## 2019-03-24 DIAGNOSIS — E559 Vitamin D deficiency, unspecified: Secondary | ICD-10-CM

## 2019-03-24 DIAGNOSIS — Z13220 Encounter for screening for lipoid disorders: Secondary | ICD-10-CM

## 2019-03-24 DIAGNOSIS — Z1329 Encounter for screening for other suspected endocrine disorder: Secondary | ICD-10-CM

## 2019-03-24 NOTE — Patient Instructions (Signed)
[facility] in [place] [phone]  Ob/gyn estrogen-progestin  -[facility] [place] [facility] in [place]  Dr. Elesa Massed [facility] [place]   Let me know if referral needed  Vitamin D3 4000 IU Daily   Galcanezumab injection What is this medicine? GALCANEZUMAB (gal ka NEZ ue mab) is used to prevent migraines and treat cluster headaches. This medicine may be used for other purposes; ask your health care provider or pharmacist if you have questions. COMMON BRAND NAME(S): Emgality What should I tell my health care provider before I take this medicine? They need to know if you have any of these conditions:  an unusual or allergic reaction to galcanezumab, other medicines, foods, dyes, or preservatives  pregnant or trying to get pregnant  breast-feeding How should I use this medicine? This medicine is for injection under the skin. You will be taught how to prepare and give this medicine. Use exactly as directed. Take your medicine at regular intervals. Do not take your medicine more often than directed. It is important that you put your used needles and syringes in a special sharps container. Do not put them in a trash can. If you do not have a sharps container, call your pharmacist or healthcare provider to get one. Talk to your pediatrician regarding the use of this medicine in children. Special care may be needed. Overdosage: If you think you have taken too much of this medicine contact a poison control center or emergency room at once. NOTE: This medicine is only for you. Do not share this medicine with others. What if I miss a dose? If you miss a dose, take it as soon as you can. If it is almost time for your next dose, take only that dose. Do not take double or extra doses. What may interact with this medicine? Interactions are not expected. This list may not describe all possible interactions. Give your health care provider a list of all the medicines, herbs,  non-prescription drugs, or dietary supplements you use. Also tell them if you smoke, drink alcohol, or use illegal drugs. Some items may interact with your medicine. What should I watch for while using this medicine? Tell your doctor or healthcare professional if your symptoms do not start to get better or if they get worse. What side effects may I notice from receiving this medicine? Side effects that you should report to your doctor or health care professional as soon as possible:  allergic reactions like skin rash, itching or hives, swelling of the face, lips, or tongue Side effects that usually do not require medical attention (report these to your doctor or health care professional if they continue or are bothersome):  pain, redness, or irritation at site where injected This list may not describe all possible side effects. Call your doctor for medical advice about side effects. You may report side effects to FDA at 1-800-FDA-1088. Where should I keep my medicine? Keep out of the reach of children. You will be instructed on how to store this medicine. Throw away any unused medicine after the expiration date on the label. NOTE: This sheet is a summary. It may not cover all possible information. If you have questions about this medicine, talk to your doctor, pharmacist, or health care provider.  2020 Elsevier/Gold Standard (2017-07-24 12:03:23)  Migraine Headache A migraine headache is an intense, throbbing pain on one side or both sides of the head. Migraine headaches may also cause other symptoms, such as nausea, vomiting, and  sensitivity to light and noise. A migraine headache can last from 4 hours to 3 days. Talk with your doctor about what things may bring on (trigger) your migraine headaches. What are the causes? The exact cause of this condition is not known. However, a migraine may be caused when nerves in the brain become irritated and release chemicals that cause inflammation of  blood vessels. This inflammation causes pain. This condition may be triggered or caused by:  Drinking alcohol.  Smoking.  Taking medicines, such as: ? Medicine used to treat chest pain (nitroglycerin). ? Birth control pills. ? Estrogen. ? Certain blood pressure medicines.  Eating or drinking products that contain nitrates, glutamate, aspartame, or tyramine. Aged cheeses, chocolate, or caffeine may also be triggers.  Doing physical activity. Other things that may trigger a migraine headache include:  Menstruation.  Pregnancy.  Hunger.  Stress.  Lack of sleep or too much sleep.  Weather changes.  Fatigue. What increases the risk? The following factors may make you more likely to experience migraine headaches:  Being a certain age. This condition is more common in people who are 41-63 years old.  Being female.  Having a family history of migraine headaches.  Being Caucasian.  Having a mental health condition, such as depression or anxiety.  Being obese. What are the signs or symptoms? The main symptom of this condition is pulsating or throbbing pain. This pain may:  Happen in any area of the head, such as on one side or both sides.  Interfere with daily activities.  Get worse with physical activity.  Get worse with exposure to bright lights or loud noises. Other symptoms may include:  Nausea.  Vomiting.  Dizziness.  General sensitivity to bright lights, loud noises, or smells. Before you get a migraine headache, you may get warning signs (an aura). An aura may include:  Seeing flashing lights or having blind spots.  Seeing bright spots, halos, or zigzag lines.  Having tunnel vision or blurred vision.  Having numbness or a tingling feeling.  Having trouble talking.  Having muscle weakness. Some people have symptoms after a migraine headache (postdromal phase), such as:  Feeling tired.  Difficulty concentrating. How is this diagnosed? A  migraine headache can be diagnosed based on:  Your symptoms.  A physical exam.  Tests, such as: ? CT scan or an MRI of the head. These imaging tests can help rule out other causes of headaches. ? Taking fluid from the spine (lumbar puncture) and analyzing it (cerebrospinal fluid analysis, or CSF analysis). How is this treated? This condition may be treated with medicines that:  Relieve pain.  Relieve nausea.  Prevent migraine headaches. Treatment for this condition may also include:  Acupuncture.  Lifestyle changes like avoiding foods that trigger migraine headaches.  Biofeedback.  Cognitive behavioral therapy. Follow these instructions at home: Medicines  Take over-the-counter and prescription medicines only as told by your health care provider.  Ask your health care provider if the medicine prescribed to you: ? Requires you to avoid driving or using heavy machinery. ? Can cause constipation. You may need to take these actions to prevent or treat constipation:  Drink enough fluid to keep your urine pale yellow.  Take over-the-counter or prescription medicines.  Eat foods that are high in fiber, such as beans, whole grains, and fresh fruits and vegetables.  Limit foods that are high in fat and processed sugars, such as fried or sweet foods. Lifestyle  Do not drink alcohol.  Do not  use any products that contain nicotine or tobacco, such as cigarettes, e-cigarettes, and chewing tobacco. If you need help quitting, ask your health care provider.  Get at least 8 hours of sleep every night.  Find ways to manage stress, such as meditation, deep breathing, or yoga. General instructions      Keep a journal to find out what may trigger your migraine headaches. For example, write down: ? What you eat and drink. ? How much sleep you get. ? Any change to your diet or medicines.  If you have a migraine headache: ? Avoid things that make your symptoms worse, such as  bright lights. ? It may help to lie down in a dark, quiet room. ? Do not drive or use heavy machinery. ? Ask your health care provider what activities are safe for you while you are experiencing symptoms.  Keep all follow-up visits as told by your health care provider. This is important. Contact a health care provider if:  You develop symptoms that are different or more severe than your usual migraine headache symptoms.  You have more than 15 headache days in one month. Get help right away if:  Your migraine headache becomes severe.  Your migraine headache lasts longer than 72 hours.  You have a fever.  You have a stiff neck.  You have vision loss.  Your muscles feel weak or like you cannot control them.  You start to lose your balance often.  You have trouble walking.  You faint.  You have a seizure. Summary  A migraine headache is an intense, throbbing pain on one side or both sides of the head. Migraines may also cause other symptoms, such as nausea, vomiting, and sensitivity to light and noise.  This condition may be treated with medicines and lifestyle changes. You may also need to avoid certain things that trigger a migraine headache.  Keep a journal to find out what may trigger your migraine headaches.  Contact your health care provider if you have more than 15 headache days in a month or you develop symptoms that are different or more severe than your usual migraine headache symptoms. This information is not intended to replace advice given to you by your health care provider. Make sure you discuss any questions you have with your health care provider. Document Revised: 05/30/2018 Document Reviewed: 03/20/2018 Elsevier Patient Education  2020 [employer].

## 2019-03-24 NOTE — Progress Notes (Signed)
Chief Complaint  Patient presents with  . Annual Exam  . Gynecologic Exam  . Migraine   Annual  1. Migraines still getting and wonders if hormone imbalance gets them start of cycle or 1 week before and end of cycle. Tried massage appt with [facility]. [facility] upcoming as she does report tension in neck and scalp. She tried going dairy free which helped and did a food sensitivity test and has some insensitivities. MRI 12/03/18 negative and saw neurology who rec imitrex, fiocet, magnesium, phenerghan which she tries not to overuse abortive medications but still having h/as's. She was also Rx topamax 12/2018 which she has not started  LMP 03/19/19 still with some spotting    Review of Systems  Constitutional: Negative for weight loss.  HENT: Negative for hearing loss.   Eyes: Negative for blurred vision.  Respiratory: Negative for shortness of breath.   Cardiovascular: Negative for chest pain.  Gastrointestinal: Negative for abdominal pain and blood in stool.  Musculoskeletal: Negative for falls.  Skin: Negative for rash.  Neurological: Positive for headaches.  Psychiatric/Behavioral: Negative for depression and suicidal ideas.   Past Medical History:  Diagnosis Date  . Allergy   . Arthritis   . GERD (gastroesophageal reflux disease)   . Heart palpitations    years ago thought 2/2 stress did not see cardiology was on amitriptyline per pt   . History of chicken pox   . Hx of migraines    Past Surgical History:  Procedure Laterality Date  . TONSILLECTOMY     2001   Family History  Problem Relation Age of Onset  . Depression Mother   . Early death Mother        76  . Heart disease Mother        MVP s/p repair and valve infected   . Learning disabilities Mother   . Stroke Mother   . Cancer Mother        skin  . Alcohol abuse Father   . Hyperlipidemia Father   . Hypertension Father   . Learning disabilities Father   . Learning disabilities Sister   . Depression Sister   .  Cancer Maternal Aunt        skin  . Cancer Maternal Uncle        skin  . COPD Maternal Grandmother   . Diabetes Maternal Grandmother   . Early death Maternal Grandfather   . Heart disease Maternal Grandfather        MI age 53   . Alcohol abuse Paternal Grandmother   . Cancer Paternal Grandmother        throat   . COPD Paternal Grandmother   . Early death Paternal Grandmother   . Alcohol abuse Paternal Grandfather   . COPD Paternal Grandfather    Social History   Socioeconomic History  . Marital status: Married    Spouse name: Not on file  . Number of children: Not on file  . Years of education: Not on file  . Highest education level: Not on file  Occupational History  . Not on file  Tobacco Use  . Smoking status: Former Research scientist (life sciences)  . Smokeless tobacco: Never Used  . Tobacco comment: smoked x 10 years on and off 5 cig per day   Substance and Sexual Activity  . Alcohol use: Yes    Comment: rare   . Drug use: Not Currently  . Sexual activity: Yes  Other Topics Concern  . 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 Difficulty of Paying Living Expenses: Not on file  Food Insecurity:   . Worried About Charity fundraiser in the Last Year: Not on file  . Ran Out of Food in the Last Year: Not on file  Transportation Needs:   . Lack of Transportation (Medical): Not on file  . Lack of Transportation (Non-Medical): Not on file  Physical Activity:   . Days of Exercise per Week: Not on file  . Minutes of Exercise per Session: Not on file  Stress:   . Feeling of Stress : Not on file  Social Connections:   . Frequency of Communication with Friends and Family: Not on file  . Frequency of Social Gatherings with Friends and Family: Not on file  . Attends Religious Services: Not on file  . Active Member of  Clubs or Organizations: Not on file  . Attends Archivist Meetings: Not on file  . Marital Status: Not on file  Intimate Partner Violence:   . Fear of Current or Ex-Partner: Not on file  . Emotionally Abused: Not on file  . Physically Abused: Not on file  . Sexually Abused: Not on file   Current Meds  Medication Sig  . Butalbital-APAP-Caffeine (FIORICET) 50-300-40 MG CAPS Take 1-2 tablets by mouth every 4 (four) hours as needed.  . cetirizine (ZYRTEC) 10 MG tablet Take 1 tablet (10 mg total) by mouth daily as needed for allergies.  . fluticasone (FLONASE) 50 MCG/ACT nasal spray Place 2 sprays into both nostrils daily.  Marland Kitchen levonorgestrel (MIRENA) 20 MCG/24HR IUD 1 each by Intrauterine route once.  . montelukast (SINGULAIR) 10 MG tablet Take 1 tablet (10 mg total) by mouth at bedtime.  . ondansetron (ZOFRAN) 4 MG tablet Take 4 mg by mouth every 8 (eight) hours as needed for nausea or vomiting.  . promethazine (PHENERGAN) 25 MG tablet Take 1 tablet (25 mg total) by mouth 2 (two) times daily as needed for nausea or vomiting.  . SUMAtriptan (IMITREX) 100 MG tablet    Allergies  Allergen Reactions  . Other     Dogs, cats, dust mites and mold     No results found for this or any previous visit (from the past 2160 hour(s)). Objective  Body mass index is 21.46 kg/m. Wt Readings from Last 3 Encounters:  03/24/19 125 lb (56.7 kg)  11/27/18 134 lb (60.8 kg)  05/01/18 129 lb (58.5 kg)   Temp Readings from Last 3 Encounters:  03/24/19 (!) 97 F (36.1 C) (Temporal)  05/01/18 98.4 F (36.9 C) (Oral)  04/04/18 97.8 F (36.6 C) (Oral)   BP Readings from Last 3 Encounters:  03/24/19 122/84  05/01/18 98/66  04/04/18 100/68   Pulse Readings from Last 3 Encounters:  03/24/19 82  05/01/18 93  04/04/18 80    Physical Exam Vitals and nursing note reviewed.  Constitutional:      Appearance: Normal appearance. She is well-developed and well-groomed.  HENT:     Head:  Normocephalic and atraumatic.  Eyes:     Conjunctiva/sclera: Conjunctivae normal.     Pupils: Pupils are equal, round, and reactive to light.  Cardiovascular:     Rate and Rhythm: Normal rate and regular rhythm.  Heart sounds: Normal heart sounds. No murmur.  Pulmonary:     Effort: Pulmonary effort is normal.     Breath sounds: Normal breath sounds.  Chest:     Chest wall: No mass.     Breasts: Breasts are symmetrical.        Right: Normal. No swelling, bleeding, inverted nipple, mass, nipple discharge, skin change or tenderness.        Left: Normal. No swelling, bleeding, inverted nipple, mass, nipple discharge, skin change or tenderness.  Abdominal:     General: Abdomen is flat. Bowel sounds are normal.     Tenderness: There is no abdominal tenderness.  Musculoskeletal:     Right lower leg: No edema.     Left lower leg: No edema.  Lymphadenopathy:     Upper Body:     Right upper body: No axillary adenopathy.     Left upper body: No axillary adenopathy.  Skin:    General: Skin is warm and dry.  Neurological:     General: No focal deficit present.     Mental Status: She is alert and oriented to person, place, and time. Mental status is at baseline.     Coordination: Romberg sign negative.  Psychiatric:        Attention and Perception: Attention and perception normal.        Mood and Affect: Mood and affect normal.        Speech: Speech normal.        Behavior: Behavior normal. Behavior is cooperative.        Thought Content: Thought content normal.        Cognition and Memory: Cognition and memory normal.        Judgment: Judgment normal.     Assessment  Plan  Annual physical exam sch fasting labs asap  Last flu shot 2017 Tdap utd 03/21/16 MMR immune  Hep B immune  HIV neg 05/13/13  rec healthy diet and exercise  Former smoker  Pap today normal with IUD intact and breast exam normal today  Has Mirena IUDsince 1.5 years since 03/20/18  Eye MD [facility] rec healthy diet and exercise   Intractable migraine without status migrainosus, unspecified migraine type  Disc f/u ob/gyn If menstrual to consider progestin/estrogen oral given list of ob/gyn recs  Cont meds per neuro and f/u neuro consider emgality  Consider accupuncture lotus in [place]    Provider: Dr. Olivia Mackie McLean-Scocuzza-[facility]

## 2019-03-24 NOTE — Telephone Encounter (Signed)
Please place future orders for lab appt.  

## 2019-03-25 LAB — CYTOLOGY - PAP
Comment: NEGATIVE
Diagnosis: NEGATIVE
High risk HPV: NEGATIVE

## 2019-03-26 ENCOUNTER — Other Ambulatory Visit: Payer: 59

## 2019-03-30 NOTE — Progress Notes (Signed)
[name] [address] [place] [address] Phone: [phone] Subjective:   I [name] am serving as a Neurosurgeon for Dr. Antoine Primas.  This visit occurred during the SARS-CoV-2 public health emergency.  Safety protocols were in place, including screening questions prior to the visit, additional usage of staff PPE, and extensive cleaning of exam room while observing appropriate contact time as indicated for disinfecting solutions.   I'm seeing this patient by the request  of:  McLean-Scocuzza, [name], MD  CC: Headaches, neck pain  FOY:[id number]  Autumn [name] is a 34 y.o. female coming in with complaint of neck pain. Chronic migraines for about 15-20 years. Lately she has noticed the pain and has determined it is tension type pain. Has been aware of posture and correcting it. Has 2 small children. Works a lot on [employer] on the computer. Time at the computer varies depending on her work load. Patient is aware of ergonomics (trained in ergonomics). Running, exercise, spin bike causes numbness in toes on the left side but is rare. ROM is limited.  Onset- 6 months  Location - right sternocleidomastoid, right temple, right jaw, center of forehead top of head down her back into her feet, occiput, scalenes  Duration- end of day she is more tight Character- migraine  Aggravating factors- ADL Reliving factors-  Therapies tried- sternoleidomastoid release Severity-8 out of 10 when it is severe     Past Medical History:  Diagnosis Date  . Allergy   . Arthritis   . GERD (gastroesophageal reflux disease)   . Heart palpitations    years ago thought 2/2 stress did not see cardiology was on amitriptyline per pt   . History of chicken pox   . Hx of migraines    Past Surgical History:  Procedure Laterality Date  . TONSILLECTOMY     2001   Social History   Socioeconomic History  . Marital status: Married    Spouse name: Not on file  . Number  of children: Not on file  . Years of education: Not on file  . Highest education level: Not on file  Occupational History  . Not on file  Tobacco Use  . Smoking status: Former Games developer  . Smokeless tobacco: Never Used  . Tobacco comment: smoked x 10 years on and off 5 cig per day   Substance and Sexual Activity  . Alcohol use: Yes    Comment: rare   . Drug use: Not Currently  . Sexual activity: Yes  Other Topics Concern  . 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 Difficulty of Paying Living Expenses: Not on file  Food Insecurity:   . Worried About Programme researcher, broadcasting/film/video in the Last Year: Not on file  . Ran Out of Food in the Last Year: Not on file  Transportation Needs:   . Lack of Transportation (Medical): Not on file  . Lack of Transportation (Non-Medical): Not on file  Physical Activity:   . Days of Exercise per Week: Not on file  . Minutes of Exercise per Session: Not on file  Stress:   . Feeling of Stress : Not on file  Social Connections:   . Frequency of Communication with  Friends and Family: Not on file  . Frequency of Social Gatherings with Friends and Family: Not on file  . Attends Religious Services: Not on file  . Active Member of Clubs or Organizations: Not on file  . Attends Banker Meetings: Not on file  . Marital Status: Not on file   Allergies  Allergen Reactions  . Other     Dogs, cats, dust mites and mold     Family History  Problem Relation Age of Onset  . Depression Mother   . Early death Mother        72  . Heart disease Mother        MVP s/p repair and valve infected   . Learning disabilities Mother   . Stroke Mother   . Cancer Mother        skin  . Alcohol abuse Father   . Hyperlipidemia Father   . Hypertension Father   . Learning disabilities  Father   . Learning disabilities Sister   . Depression Sister   . Cancer Maternal Aunt        skin  . Cancer Maternal Uncle        skin  . COPD Maternal Grandmother   . Diabetes Maternal Grandmother   . Early death Maternal Grandfather   . Heart disease Maternal Grandfather        MI age 45   . Alcohol abuse Paternal Grandmother   . Cancer Paternal Grandmother        throat   . COPD Paternal Grandmother   . Early death Paternal Grandmother   . Alcohol abuse Paternal Grandfather   . COPD Paternal Grandfather     Current Outpatient Medications (Endocrine & Metabolic):  .  levonorgestrel (MIRENA) 20 MCG/24HR IUD, 1 each by Intrauterine route once.   Current Outpatient Medications (Respiratory):  .  cetirizine (ZYRTEC) 10 MG tablet, Take 1 tablet (10 mg total) by mouth daily as needed for allergies. .  fluticasone (FLONASE) 50 MCG/ACT nasal spray, Place 2 sprays into both nostrils daily. .  montelukast (SINGULAIR) 10 MG tablet, Take 1 tablet (10 mg total) by mouth at bedtime. .  promethazine (PHENERGAN) 25 MG tablet, Take 1 tablet (25 mg total) by mouth 2 (two) times daily as needed for nausea or vomiting.  Current Outpatient Medications (Analgesics):  Marland Kitchen  Butalbital-APAP-Caffeine (FIORICET) 50-300-40 MG CAPS, Take 1-2 tablets by mouth every 4 (four) hours as needed. .  SUMAtriptan (IMITREX) 100 MG tablet,    Current Outpatient Medications (Other):  .  ondansetron (ZOFRAN) 4 MG tablet, Take 4 mg by mouth every 8 (eight) hours as needed for nausea or vomiting.   Reviewed prior external information including notes and imaging from  primary care provider As well as notes that were available from care everywhere and other healthcare systems.  Past medical history, social, surgical and family history all reviewed in electronic medical record.  No pertanent information unless stated regarding to the chief complaint.   Review of Systems:  No headache, visual changes, nausea,  vomiting, diarrhea, constipation, dizziness, abdominal pain, skin rash, fevers, chills, night sweats, weight loss, swollen lymph nodes, body aches, joint swelling, chest pain, shortness of breath, mood changes. POSITIVE muscle aches  Objective  Last menstrual period 03/19/2019.   General: No apparent distress alert and oriented x3 mood and affect normal, dressed appropriately.  HEENT: Pupils equal, extraocular movements intact  Respiratory: Patient's speak in full sentences and does not appear short of breath  Cardiovascular: No lower extremity edema, non tender, no erythema  Skin: Warm dry intact with no signs of infection or rash on extremities or on axial skeleton.  Abdomen: Soft nontender  Neuro: Cranial nerves II through XII are intact, neurovascularly intact in all extremities with 2+ DTRs and 2+ pulses.  Lymph: No lymphadenopathy of posterior or anterior cervical chain or axillae bilaterally.  Gait normal with good balance and coordination.  MSK:  Non tender with full range of motion and good stability and symmetric strength and tone of shoulders, elbows, wrist, hip, knee and ankles bilaterally.  Neck exam does have some mild loss of lordosis.  Some mild scapular dyskinesis noted right greater than left.  Good grip strength though noted.  Negative Spurling's.  Osteopathic findings C2 flexed rotated and side bent right C4 flexed rotated and side bent left C6 flexed rotated and side bent left T3 extended rotated and side bent right inhaled third rib T6 extended rotated and side bent left L1 flexed rotated and side bent right Sacrum right on right Pelvic shear noted    Impression and Recommendations:     This case required medical decision making of moderate complexity. The above documentation has been reviewed and is accurate and complete Lyndal Pulley, DO       Note: This dictation was prepared with Dragon dictation along with smaller phrase technology. Any  transcriptional errors that result from this process are unintentional.

## 2019-03-31 ENCOUNTER — Ambulatory Visit (INDEPENDENT_AMBULATORY_CARE_PROVIDER_SITE_OTHER): Payer: 59 | Admitting: Family Medicine

## 2019-03-31 ENCOUNTER — Encounter: Payer: Self-pay | Admitting: Family Medicine

## 2019-03-31 ENCOUNTER — Other Ambulatory Visit: Payer: Self-pay

## 2019-03-31 DIAGNOSIS — G43919 Migraine, unspecified, intractable, without status migrainosus: Secondary | ICD-10-CM

## 2019-03-31 DIAGNOSIS — M999 Biomechanical lesion, unspecified: Secondary | ICD-10-CM

## 2019-03-31 MED ORDER — VITAMIN D (ERGOCALCIFEROL) 1.25 MG (50000 UNIT) PO CAPS: 50000.0000 [IU] | ORAL_CAPSULE | ORAL | 0 refills | Status: DC

## 2019-03-31 NOTE — Assessment & Plan Note (Signed)
Decision today to treat with OMT was based on Physical Exam  After verbal consent patient was treated with HVLA, ME, FPR techniques in cervical, thoracic, rib,  lumbar and sacral areas  Patient tolerated the procedure well with improvement in symptoms  Patient given exercises, stretches and lifestyle modifications  See medications in patient instructions if given  Patient will follow up in 4-8 weeks 

## 2019-03-31 NOTE — Patient Instructions (Addendum)
Good to see you.  Calcium pyruvate 1500 mg daily Exercises 3 times a week.  Once weekly vitamin D for 12 weeks.  CoQ10 200 mg  See me again in 4-5 weeks

## 2019-03-31 NOTE — Assessment & Plan Note (Signed)
Patient does have a history of migraines.  Seen multiple different providers for it.  Discussed vitamin D supplementation and other over-the-counter medications, posture ergonomics and other changes that I think will be more beneficial.  Discussed which activities to do which was to avoid.  Increase activity as tolerated.  Spondee well to manipulation.  Follow-up again in 4 to 6 weeks

## 2019-04-06 ENCOUNTER — Other Ambulatory Visit: Payer: Self-pay

## 2019-04-06 ENCOUNTER — Other Ambulatory Visit (INDEPENDENT_AMBULATORY_CARE_PROVIDER_SITE_OTHER): Payer: 59

## 2019-04-06 DIAGNOSIS — Z13220 Encounter for screening for lipoid disorders: Secondary | ICD-10-CM | POA: Diagnosis not present

## 2019-04-06 DIAGNOSIS — Z1389 Encounter for screening for other disorder: Secondary | ICD-10-CM

## 2019-04-06 DIAGNOSIS — Z1329 Encounter for screening for other suspected endocrine disorder: Secondary | ICD-10-CM | POA: Diagnosis not present

## 2019-04-06 DIAGNOSIS — E559 Vitamin D deficiency, unspecified: Secondary | ICD-10-CM | POA: Diagnosis not present

## 2019-04-06 DIAGNOSIS — Z0000 Encounter for general adult medical examination without abnormal findings: Secondary | ICD-10-CM | POA: Diagnosis not present

## 2019-04-06 LAB — LIPID PANEL
Cholesterol: 172 mg/dL (ref 0–200)
HDL: 70.4 mg/dL (ref >39.00)
LDL Cholesterol: 93 mg/dL (ref 0–99)
NonHDL: 101.71
Total CHOL/HDL Ratio: 2
Triglycerides: 42 mg/dL (ref 0.0–149.0)
VLDL: 8.4 mg/dL (ref 0.0–40.0)

## 2019-04-06 LAB — COMPREHENSIVE METABOLIC PANEL
ALT: 14 U/L (ref 0–35)
AST: 15 U/L (ref 0–37)
Albumin: 4.6 g/dL (ref 3.5–5.2)
Alkaline Phosphatase: 54 U/L (ref 39–117)
BUN: 14 mg/dL (ref 6–23)
CO2: 26 mEq/L (ref 19–32)
Calcium: 9.3 mg/dL (ref 8.4–10.5)
Chloride: 103 mEq/L (ref 96–112)
Creatinine, Ser: 0.89 mg/dL (ref 0.40–1.20)
GFR: 71.86 mL/min (ref >60.00)
Glucose, Bld: 95 mg/dL (ref 70–99)
Potassium: 4 mEq/L (ref 3.5–5.1)
Sodium: 137 mEq/L (ref 135–145)
Total Bilirubin: 0.6 mg/dL (ref 0.2–1.2)
Total Protein: 6.9 g/dL (ref 6.0–8.3)

## 2019-04-06 LAB — CBC WITH DIFFERENTIAL/PLATELET
Basophils Absolute: 0 10*3/uL (ref 0.0–0.1)
Basophils Relative: 0.6 % (ref 0.0–3.0)
Eosinophils Absolute: 0.1 10*3/uL (ref 0.0–0.7)
Eosinophils Relative: 2.3 % (ref 0.0–5.0)
HCT: 39.7 % (ref 36.0–46.0)
Hemoglobin: 13.6 g/dL (ref 12.0–15.0)
Lymphocytes Relative: 31 % (ref 12.0–46.0)
Lymphs Abs: 1.9 10*3/uL (ref 0.7–4.0)
MCHC: 34.3 g/dL (ref 30.0–36.0)
MCV: 94.1 fl (ref 78.0–100.0)
Monocytes Absolute: 0.4 10*3/uL (ref 0.1–1.0)
Monocytes Relative: 6.8 % (ref 3.0–12.0)
Neutro Abs: 3.7 10*3/uL (ref 1.4–7.7)
Neutrophils Relative %: 59.3 % (ref 43.0–77.0)
Platelets: 197 10*3/uL (ref 150.0–400.0)
RBC: 4.22 Mil/uL (ref 3.87–5.11)
RDW: 13.2 % (ref 11.5–15.5)
WBC: 6.2 10*3/uL (ref 4.0–10.5)

## 2019-04-06 LAB — VITAMIN D 25 HYDROXY (VIT D DEFICIENCY, FRACTURES): VITD: 43.76 ng/mL (ref 30.00–100.00)

## 2019-04-06 LAB — TSH: TSH: 1 u[IU]/mL (ref 0.35–4.50)

## 2019-04-07 LAB — URINALYSIS, ROUTINE W REFLEX MICROSCOPIC
Bilirubin Urine: NEGATIVE
Glucose, UA: NEGATIVE
Hgb urine dipstick: NEGATIVE
Ketones, ur: NEGATIVE
Leukocytes,Ua: NEGATIVE
Nitrite: NEGATIVE
Protein, ur: NEGATIVE
Specific Gravity, Urine: 1.008 (ref 1.001–1.03)
pH: 6 (ref 5.0–8.0)

## 2019-05-04 NOTE — Assessment & Plan Note (Signed)
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  Discussed social determinants of health and stress seeming to be overall.  We discussed and given guidance on different resources.  Patient is to begin her home exercise, icing regimen, posture and ergonomics.  Medication management discussed.  Follow-up again in 4 to 8 weeks

## 2019-05-04 NOTE — Assessment & Plan Note (Signed)
Decision today to treat with OMT was based on Physical Exam  After verbal consent patient was treated with HVLA, ME, FPR techniques in cervical, thoracic, rib, l areas  Patient tolerated the procedure well with improvement in symptoms  Patient given exercises, stretches and lifestyle modifications  See medications in patient instructions if given  Patient will follow up in 4-8 weeks 

## 2019-05-04 NOTE — Progress Notes (Signed)
error 

## 2019-05-05 ENCOUNTER — Ambulatory Visit: Payer: 59 | Admitting: Family Medicine

## 2019-05-13 ENCOUNTER — Ambulatory Visit (INDEPENDENT_AMBULATORY_CARE_PROVIDER_SITE_OTHER): Payer: 59

## 2019-05-13 ENCOUNTER — Encounter: Payer: Self-pay | Admitting: Family Medicine

## 2019-05-13 ENCOUNTER — Ambulatory Visit (INDEPENDENT_AMBULATORY_CARE_PROVIDER_SITE_OTHER): Payer: 59 | Admitting: Family Medicine

## 2019-05-13 ENCOUNTER — Other Ambulatory Visit: Payer: Self-pay

## 2019-05-13 VITALS — BP 98/70 | HR 66 | Ht 64.0 in | Wt 122.0 lb

## 2019-05-13 DIAGNOSIS — M542 Cervicalgia: Secondary | ICD-10-CM

## 2019-05-13 DIAGNOSIS — G43919 Migraine, unspecified, intractable, without status migrainosus: Secondary | ICD-10-CM

## 2019-05-13 DIAGNOSIS — M999 Biomechanical lesion, unspecified: Secondary | ICD-10-CM | POA: Diagnosis not present

## 2019-05-13 NOTE — Progress Notes (Signed)
[name] [address] [place] [address] Phone: [phone] Subjective:   I [name] am serving as a Neurosurgeon for Dr. Antoine Primas.  This visit occurred during the SARS-CoV-2 public health emergency.  Safety protocols were in place, including screening questions prior to the visit, additional usage of staff PPE, and extensive cleaning of exam room while observing appropriate contact time as indicated for disinfecting solutions.   I'm seeing this patient by the request  of:  McLean-Scocuzza, [name], MD  CC: Headache and neck pain follow-up  [id number]  Autumn [name] is a 34 y.o. female coming in with complaint of headaches and cervical spine pain. Last seen for OMT on 05/05/2019. Patient states she has intermitted pain. Was good for a few weeks. This past week she has noticed a lot of pain and stiffness in her neck. Entire right arm as been bothering her. Pain in the wrist, posterior shoulder and elbow. Some weakness in the hand and wrist. Believes ADLs have something to do with the arm pain. Achy pain.  Notices when she is on the computer on a more regular basis some more discomfort.  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 Allergy   . Arthritis   . GERD (gastroesophageal reflux disease)   . Heart palpitations    years ago thought 2/2 stress did not see cardiology was on amitriptyline per pt   . History of chicken pox   . Hx of migraines    Past Surgical History:  Procedure Laterality Date  . TONSILLECTOMY     2001   Social History   Socioeconomic History  . Marital status: Married    Spouse name: Not on file  . Number of children: Not on file  . Years of education: Not on file  . Highest education level: Not on file  Occupational History  . Not on file  Tobacco Use  . Smoking status: Former Games developer  . Smokeless tobacco: Never Used  . Tobacco comment: smoked  x 10 years on and off 5 cig per day   Substance and Sexual Activity  . Alcohol use: Yes    Comment: rare   . Drug use: Not Currently  . Sexual activity: Yes  Other Topics Concern  . 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 Difficulty of Paying Living Expenses:   Food Insecurity:   . Worried About Programme researcher, broadcasting/film/video in the Last Year:   . Barista in the Last Year:   Transportation Needs:   . Freight forwarder (Medical):   Marland Kitchen Lack of Transportation (Non-Medical):   Physical Activity:   . Days of Exercise per Week:   . Minutes of Exercise per Session:   Stress:   . Feeling of Stress :   Social Connections:   . Frequency of Communication with Friends and Family:   . Frequency of Social Gatherings with Friends and Family:   . Attends Religious Services:   . Active Member of Clubs or Organizations:   . Attends Banker Meetings:   Marland Kitchen Marital Status:    Allergies  Allergen Reactions  .  Other     Dogs, cats, dust mites and mold     Family History  Problem Relation Age of Onset  . Depression Mother   . Early death Mother        51  . Heart disease Mother        MVP s/p repair and valve infected   . Learning disabilities Mother   . Stroke Mother   . Cancer Mother        skin  . Alcohol abuse Father   . Hyperlipidemia Father   . Hypertension Father   . Learning disabilities Father   . Learning disabilities Sister   . Depression Sister   . Cancer Maternal Aunt        skin  . Cancer Maternal Uncle        skin  . COPD Maternal Grandmother   . Diabetes Maternal Grandmother   . Early death Maternal Grandfather   . Heart disease Maternal Grandfather        MI age 30   . Alcohol abuse Paternal Grandmother   . Cancer Paternal Grandmother        throat   . COPD Paternal  Grandmother   . Early death Paternal Grandmother   . Alcohol abuse Paternal Grandfather   . COPD Paternal Grandfather     Current Outpatient Medications (Endocrine & Metabolic):  .  levonorgestrel (MIRENA) 20 MCG/24HR IUD, 1 each by Intrauterine route once.   Current Outpatient Medications (Respiratory):  .  cetirizine (ZYRTEC) 10 MG tablet, Take 1 tablet (10 mg total) by mouth daily as needed for allergies. .  fluticasone (FLONASE) 50 MCG/ACT nasal spray, Place 2 sprays into both nostrils daily. .  montelukast (SINGULAIR) 10 MG tablet, Take 1 tablet (10 mg total) by mouth at bedtime. .  promethazine (PHENERGAN) 25 MG tablet, Take 1 tablet (25 mg total) by mouth 2 (two) times daily as needed for nausea or vomiting.  Current Outpatient Medications (Analgesics):  Marland Kitchen  Butalbital-APAP-Caffeine (FIORICET) 50-300-40 MG CAPS, Take 1-2 tablets by mouth every 4 (four) hours as needed. .  SUMAtriptan (IMITREX) 100 MG tablet,    Current Outpatient Medications (Other):  .  ondansetron (ZOFRAN) 4 MG tablet, Take 4 mg by mouth every 8 (eight) hours as needed for nausea or vomiting. .  Vitamin D, Ergocalciferol, (DRISDOL) 1.25 MG (50000 UNIT) CAPS capsule, Take 1 capsule (50,000 Units total) by mouth every 7 (seven) days.   Reviewed prior external information including notes and imaging from  primary care provider As well as notes that were available from care everywhere and other healthcare systems.  Past medical history, social, surgical and family history all reviewed in electronic medical record.  No pertanent information unless stated regarding to the chief complaint.   Review of Systems:  No headache, visual changes, nausea, vomiting, diarrhea, constipation, dizziness, abdominal pain, skin rash, fevers, chills, night sweats, weight loss, swollen lymph nodes, body aches, joint swelling, chest pain, shortness of breath, mood changes. POSITIVE muscle aches  Objective  Blood pressure 98/70,  pulse 66, height 5\' 4"  (1.626 m), weight 122 lb (55.3 kg), SpO2 98 %.   General: No apparent distress alert and oriented x3 mood and affect normal, dressed appropriately.  HEENT: Pupils equal, extraocular movements intact  Respiratory: Patient's speak in full sentences and does not appear short of breath  Cardiovascular: No lower extremity edema, non tender, no erythema  Neuro: Cranial nerves II through XII are intact, neurovascularly intact in all extremities with 2+  DTRs and 2+ pulses.  Gait normal with good balance and coordination.  MSK:   Neck Exam shows that patient does have some mild tightness noted in the parascapular region right greater than left.  Slight brain syndrome noted on the right side.  Neck does have some mild loss of rotation and side bending to the right  Osteopathic findings C2 flexed rotated and side bent right C6 flexed rotated and side bent left T3 extended rotated and side bent right inhaled third rib T7 extended rotated and side bent left     Impression and Recommendations:     This case required medical decision making of moderate complexity. The above documentation has been reviewed and is accurate and complete Lyndal Pulley, DO       Note: This dictation was prepared with Dragon dictation along with smaller phrase technology. Any transcriptional errors that result from this process are unintentional.

## 2019-05-13 NOTE — Assessment & Plan Note (Signed)
Decision today to treat with OMT was based on Physical Exam  After verbal consent patient was treated with HVLA, ME, FPR techniques in cervical, thoracic, rib,   Patient tolerated the procedure well with improvement in symptoms  Patient given exercises, stretches and lifestyle modifications  See medications in patient instructions if given  Patient will follow up in 4-8 weeks 

## 2019-05-13 NOTE — Patient Instructions (Signed)
Making progress Xray today Continue vitamins and exercises See me again in 5-6 weeks

## 2019-05-13 NOTE — Assessment & Plan Note (Signed)
Patient does have the cervicogenic headaches and migraines.  Is responding somewhat.  Home exercises and icing regimen.  Patient is doing well with the vitamin supplementations.  X-rays of the neck ordered today to further evaluate but likely will be normal.  Patient will follow up with me again 4 to 8 weeks.  Discussed medication management including vitamin D supplementation.

## 2019-06-17 ENCOUNTER — Ambulatory Visit (INDEPENDENT_AMBULATORY_CARE_PROVIDER_SITE_OTHER): Payer: 59 | Admitting: Family Medicine

## 2019-06-17 ENCOUNTER — Encounter: Payer: Self-pay | Admitting: Family Medicine

## 2019-06-17 ENCOUNTER — Other Ambulatory Visit: Payer: Self-pay

## 2019-06-17 VITALS — BP 100/72 | HR 77 | Ht 64.0 in | Wt 127.0 lb

## 2019-06-17 DIAGNOSIS — M999 Biomechanical lesion, unspecified: Secondary | ICD-10-CM

## 2019-06-17 DIAGNOSIS — G43919 Migraine, unspecified, intractable, without status migrainosus: Secondary | ICD-10-CM

## 2019-06-17 NOTE — Assessment & Plan Note (Signed)
   Decision today to treat with OMT was based on Physical Exam  After verbal consent patient was treated with HVLA, ME, FPR techniques in cervical, thoracic, rib areas, all areas are chronic   Patient tolerated the procedure well with improvement in symptoms  Patient given exercises, stretches and lifestyle modifications  See medications in patient instructions if given  Patient will follow up in 4-8 weeks 

## 2019-06-17 NOTE — Patient Instructions (Addendum)
Good to see you Iron 65 mg with 500 vitamin c daily Continue everything else Include exercises with your other ones  See me again in 6-8 weeks

## 2019-06-17 NOTE — Progress Notes (Signed)
[name] [address] [place] [address] Phone: [phone] Subjective:   I [name] am serving as a Neurosurgeon for Dr. Antoine Primas.  This visit occurred during the SARS-CoV-2 public health emergency.  Safety protocols were in place, including screening questions prior to the visit, additional usage of staff PPE, and extensive cleaning of exam room while observing appropriate contact time as indicated for disinfecting solutions.   I'm seeing this patient by the request  of:  McLean-Scocuzza, [name], MD  CC: Neck pain and back pain follow-up  [id number]  Earlene Bjelland is a 34 y.o. female coming in with complaint of neck pain. Last seen on 05/13/2019 for OMT. Patient states she is doing well. A week after her last visit she had a flare. States she has moved her mouse and that issue has resolved.  Patient continues to have some discomfort from time to time.  Sometimes seems to be worse at night.  Patient states that almost a cramping sensation occurs down the arms.     Past Medical History:  Diagnosis Date  . Allergy   . Arthritis   . GERD (gastroesophageal reflux disease)   . Heart palpitations    years ago thought 2/2 stress did not see cardiology was on amitriptyline per pt   . History of chicken pox   . Hx of migraines    Past Surgical History:  Procedure Laterality Date  . TONSILLECTOMY     2001   Social History   Socioeconomic History  . Marital status: Married    Spouse name: Not on file  . Number of children: Not on file  . Years of education: Not on file  . Highest education level: Not on file  Occupational History  . Not on file  Tobacco Use  . Smoking status: Former Games developer  . Smokeless tobacco: Never Used  . Tobacco comment: smoked x 10 years on and off 5 cig per day   Substance and Sexual Activity  . Alcohol use: Yes    Comment: rare   . Drug use: Not Currently  . Sexual activity: Yes  Other Topics Concern    . 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 Difficulty of Paying Living Expenses:   Food Insecurity:   . Worried About Programme researcher, broadcasting/film/video in the Last Year:   . Barista in the Last Year:   Transportation Needs:   . Freight forwarder (Medical):   Marland Kitchen Lack of Transportation (Non-Medical):   Physical Activity:   . Days of Exercise per Week:   . Minutes of Exercise per Session:   Stress:   . Feeling of Stress :   Social Connections:   . Frequency of Communication with Friends and Family:   . Frequency of Social Gatherings with Friends and Family:   . Attends Religious Services:   . Active Member of Clubs or Organizations:   . Attends Banker Meetings:   Marland Kitchen Marital Status:    Allergies  Allergen Reactions  . Other     Dogs, cats, dust mites and mold     Family History  Problem Relation Age of Onset  . Depression Mother   . Early  death Mother        20  . Heart disease Mother        MVP s/p repair and valve infected   . Learning disabilities Mother   . Stroke Mother   . Cancer Mother        skin  . Alcohol abuse Father   . Hyperlipidemia Father   . Hypertension Father   . Learning disabilities Father   . Learning disabilities Sister   . Depression Sister   . Cancer Maternal Aunt        skin  . Cancer Maternal Uncle        skin  . COPD Maternal Grandmother   . Diabetes Maternal Grandmother   . Early death Maternal Grandfather   . Heart disease Maternal Grandfather        MI age 52   . Alcohol abuse Paternal Grandmother   . Cancer Paternal Grandmother        throat   . COPD Paternal Grandmother   . Early death Paternal Grandmother   . Alcohol abuse Paternal Grandfather   . COPD Paternal Grandfather     Current Outpatient Medications (Endocrine & Metabolic):   .  levonorgestrel (MIRENA) 20 MCG/24HR IUD, 1 each by Intrauterine route once.   Current Outpatient Medications (Respiratory):  .  cetirizine (ZYRTEC) 10 MG tablet, Take 1 tablet (10 mg total) by mouth daily as needed for allergies. .  fluticasone (FLONASE) 50 MCG/ACT nasal spray, Place 2 sprays into both nostrils daily. .  montelukast (SINGULAIR) 10 MG tablet, Take 1 tablet (10 mg total) by mouth at bedtime. .  promethazine (PHENERGAN) 25 MG tablet, Take 1 tablet (25 mg total) by mouth 2 (two) times daily as needed for nausea or vomiting.  Current Outpatient Medications (Analgesics):  Marland Kitchen  Butalbital-APAP-Caffeine (FIORICET) 50-300-40 MG CAPS, Take 1-2 tablets by mouth every 4 (four) hours as needed. .  SUMAtriptan (IMITREX) 100 MG tablet,    Current Outpatient Medications (Other):  .  ondansetron (ZOFRAN) 4 MG tablet, Take 4 mg by mouth every 8 (eight) hours as needed for nausea or vomiting. .  Vitamin D, Ergocalciferol, (DRISDOL) 1.25 MG (50000 UNIT) CAPS capsule, Take 1 capsule (50,000 Units total) by mouth every 7 (seven) days.   Reviewed prior external information including notes and imaging from  primary care provider As well as notes that were available from care everywhere and other healthcare systems.  Past medical history, social, surgical and family history all reviewed in electronic medical record.  No pertanent information unless stated regarding to the chief complaint.   Review of Systems:  No headache, visual changes, nausea, vomiting, diarrhea, constipation, dizziness, abdominal pain, skin rash, fevers, chills, night sweats, weight loss, swollen lymph nodes, body aches, joint swelling, chest pain, shortness of breath, mood changes. POSITIVE muscle aches  Objective  Blood pressure 100/72, pulse 77, height 5\' 4"  (1.626 m), weight 127 lb (57.6 kg), SpO2 99 %.   General: No apparent distress alert and oriented x3 mood and affect normal, dressed appropriately.  HEENT:  Pupils equal, extraocular movements intact  Respiratory: Patient's speak in full sentences and does not appear short of breath  Cardiovascular: No lower extremity edema, non tender, no erythema  Neuro: Cranial nerves II through XII are intact, neurovascularly intact in all extremities with 2+ DTRs and 2+ pulses.  Gait normal with good balance and coordination.  MSK: Neck pain mild loss of lordosis.  Patient does have tightness in the parascapular region.  Patient  does have some negative Spurling's.  5 out of 5 strength of the upper extremities.  Osteopathic findings C3 flexed rotated and side bent right C7 flexed rotated and side bent left T3 extended rotated and side bent left inhaled third rib       Impression and Recommendations:     This case required medical decision making of moderate complexity. The above documentation has been reviewed and is accurate and complete Lyndal Pulley, DO       Note: This dictation was prepared with Dragon dictation along with smaller phrase technology. Any transcriptional errors that result from this process are unintentional.

## 2019-06-17 NOTE — Assessment & Plan Note (Signed)
Patient has a history of migraines and cervicogenic headaches.  Has been doing relatively well overall.  Has had a couple headaches since last exam.  Discussed home exercises, icing regimen, ergonomics.  Responding well to manipulation.  Discussed other natural approaches as well.  Patient will continue to stay active and see me again 4 to 8 weeks

## 2019-07-30 ENCOUNTER — Ambulatory Visit (INDEPENDENT_AMBULATORY_CARE_PROVIDER_SITE_OTHER): Payer: 59 | Admitting: Family Medicine

## 2019-07-30 ENCOUNTER — Other Ambulatory Visit: Payer: Self-pay

## 2019-07-30 VITALS — BP 110/64 | HR 73 | Ht 64.0 in | Wt 125.0 lb

## 2019-07-30 DIAGNOSIS — G43919 Migraine, unspecified, intractable, without status migrainosus: Secondary | ICD-10-CM | POA: Diagnosis not present

## 2019-07-30 DIAGNOSIS — M999 Biomechanical lesion, unspecified: Secondary | ICD-10-CM

## 2019-07-30 NOTE — Progress Notes (Signed)
Autumn [name] [place] Phone: [phone] Subjective:   Autumn [name], am serving as a scribe for Dr. Hulan Saas. This visit occurred during the SARS-CoV-2 public health emergency.  Safety protocols were in place, including screening questions prior to the visit, additional usage of staff PPE, and extensive cleaning of exam room while observing appropriate contact time as indicated for disinfecting solutions.   I'm seeing this patient by the request  of:  McLean-Scocuzza, Autumn Glow, MD  CC: Back pain follow-up  [id number]  Autumn [name] is a 34 y.o. female coming in with complaint of back and neck pain. OMT 06/17/2019. Patient states that she has been doing good since last visit. Little to [name] back pain.  Patient has been able to be very active at this time.  [name] new symptoms         Reviewed prior external information including notes and imaging from previsou exam, outside providers and external EMR if available.   As well as notes that were available from care everywhere and other healthcare systems.  Past medical history, social, surgical and family history all reviewed in electronic medical record.  [name] pertanent information unless stated regarding to the chief complaint.   Past Medical History:  Diagnosis Date  . Allergy   . Arthritis   . GERD (gastroesophageal reflux disease)   . Heart palpitations    years ago thought 2/2 stress did not see cardiology was on amitriptyline per pt   . History of chicken pox   . Hx of migraines     Allergies  Allergen Reactions  . Other     Dogs, cats, dust mites and mold       Review of Systems:  [name] headache, visual changes, nausea, vomiting, diarrhea, constipation, dizziness, abdominal pain, skin rash, fevers, chills, night sweats, weight loss, swollen lymph nodes, body aches, joint swelling, chest pain, shortness of breath, mood changes. POSITIVE muscle  aches  Objective  There were [name] vitals taken for this visit.   General: [name] apparent distress alert and oriented x3 mood and affect normal, dressed appropriately.  HEENT: Pupils equal, extraocular movements intact  Respiratory: Patient's speak in full sentences and does not appear short of breath  Cardiovascular: [name] lower extremity edema, non tender, [name] erythema  Neuro: Cranial nerves II through XII are intact, neurovascularly intact in all extremities with 2+ DTRs and 2+ pulses.  Gait normal with good balance and coordination.  MSK:  Non tender with full range of motion and good stability and symmetric strength and tone of shoulders, elbows, wrist, hip, knee and ankles bilaterally.  Back -very mild pain in the parascapular region right greater than left.  Patient does have some mild tightness of the neck noted also with sidebending bilaterally right greater than left.  Osteopathic findings  C4 flexed rotated and side bent right C7 flexed rotated and side bent left T3 extended rotated and side bent right inhaled rib T8 extended rotated and side bent left       Assessment and Plan:  Migraine Stable overall.  Patient is doing well.  I believe that the medication that she is on is doing decent and encouraged her to continue with the home exercises for the strengthening of the scapular region.  With patient doing so well we will space her out to 2 to 36-month intervals    Nonallopathic problems  Decision today to treat with OMT was based on Physical Exam  After verbal consent patient was treated with HVLA, ME, FPR techniques in cervical, rib, thoracic  areas  Patient tolerated the procedure well with improvement in symptoms  Patient given exercises, stretches and lifestyle modifications  See medications in patient instructions if given  Patient will follow up in 8-16 weeks      The above documentation has been reviewed and is accurate and complete Judi Saa, DO        Note: This dictation was prepared with Dragon dictation along with smaller phrase technology. Any transcriptional errors that result from this process are unintentional.

## 2019-07-30 NOTE — Patient Instructions (Signed)
overall doing great altra shoes Keep taking time for yourself See me again in 7-8 weeks

## 2019-07-31 ENCOUNTER — Encounter: Payer: Self-pay | Admitting: Family Medicine

## 2019-07-31 NOTE — Assessment & Plan Note (Signed)
Stable overall.  Patient is doing well.  I believe that the medication that she is on is doing decent and encouraged her to continue with the home exercises for the strengthening of the scapular region.  With patient doing so well we will space her out to 2 to 49-month intervals

## 2019-08-23 ENCOUNTER — Other Ambulatory Visit: Payer: Self-pay | Admitting: Family Medicine

## 2019-09-08 ENCOUNTER — Other Ambulatory Visit: Payer: Self-pay

## 2019-09-08 ENCOUNTER — Ambulatory Visit (INDEPENDENT_AMBULATORY_CARE_PROVIDER_SITE_OTHER): Payer: 59 | Admitting: Family Medicine

## 2019-09-08 ENCOUNTER — Encounter: Payer: Self-pay | Admitting: Family Medicine

## 2019-09-08 VITALS — BP 120/78 | HR 65 | Ht 64.0 in | Wt 125.0 lb

## 2019-09-08 DIAGNOSIS — G43719 Chronic migraine without aura, intractable, without status migrainosus: Secondary | ICD-10-CM

## 2019-09-08 DIAGNOSIS — M999 Biomechanical lesion, unspecified: Secondary | ICD-10-CM

## 2019-09-08 NOTE — Progress Notes (Signed)
[name] [address] [place] [address] Phone: [phone] Subjective:   Autumn [name], Autumn [name], am serving as a scribe for Dr. Antoine Primas.  This visit occurred during the SARS-CoV-2 public health emergency.  Safety protocols were in place, including screening questions prior to the visit, additional usage of staff PPE, and extensive cleaning of exam room while observing appropriate contact time as indicated for disinfecting solutions.   I'm seeing this patient by the request  of:  McLean-Scocuzza, [name], MD  CC: Back and neck pain follow up   [id number]  Autumn [name] is a 34 y.o. female coming in with complaint of back and neck pain Patient states has been doing okay after last manipulation she noticed some slight shooting pain down her left arm that did go away. Patient has tension in her neck today.   Medications patient has been prescribed: Vitamin D  Taking: Yes         Reviewed prior external information including notes and imaging from previsou exam, outside providers and external EMR if available.   As well as notes that were available from care everywhere and other healthcare systems.  Past medical history, social, surgical and family history all reviewed in electronic medical record.  No pertanent information unless stated regarding to the chief complaint.   Past Medical History:  Diagnosis Date  . Allergy   . Arthritis   . GERD (gastroesophageal reflux disease)   . Heart palpitations    years ago thought 2/2 stress did not see cardiology was on amitriptyline per pt   . History of chicken pox   . Hx of migraines     Allergies  Allergen Reactions  . Other     Dogs, cats, dust mites and mold       Review of Systems:  No  visual changes, nausea, vomiting, diarrhea, constipation, dizziness, abdominal pain, skin rash, fevers, chills, night sweats, weight loss, swollen lymph nodes, body aches, joint swelling, chest  pain, shortness of breath, mood changes. POSITIVE muscle aches, intermittent headaches but improvement  Objective  Blood pressure 120/78, pulse 65, height 5\' 4"  (1.626 m), weight 125 lb (56.7 kg), SpO2 99 %.   General: No apparent distress alert and oriented x3 mood and affect normal, dressed appropriately.  HEENT: Pupils equal, extraocular movements intact  Respiratory: Patient's speak in full sentences and does not appear short of breath  Cardiovascular: No lower extremity edema, non tender, no erythema  Neuro: Cranial nerves II through XII are intact, neurovascularly intact in all extremities with 2+ DTRs and 2+ pulses.  Gait normal with good balance and coordination.  MSK:  Non tender with full range of motion and good stability and symmetric strength and tone of shoulders, elbows, wrist, hip, knee and ankles bilaterally.  Back - Normal skin, Spine with normal alignment and no deformity.  No tenderness to vertebral process palpation.  Paraspinous muscles are not tender and without spasm.   Range of motion is full at neck and lumbar sacral regions  Osteopathic findings  C2 flexed rotated and side bent right C5 flexed rotated and side bent left T3 extended rotated and side bent right inhaled rib T5 extended rotated and side bent left        Assessment and Plan:  Migraine Chronic problem but stable.  Making some progress.  Responding well to manipulation.  Follow-up with me again in 8 to 12 weeks   Nonallopathic problems  Decision today to treat with  OMT was based on Physical Exam  After verbal consent patient was treated with HVLA, ME, FPR techniques in cervical, rib, thoracic, areas  Patient tolerated the procedure well with improvement in symptoms  Patient given exercises, stretches and lifestyle modifications  See medications in patient instructions if given  Patient will follow up in 8-12 weeks      The above documentation has been reviewed and is accurate and  complete Judi Saa, DO       Note: This dictation was prepared with Dragon dictation along with smaller phrase technology. Any transcriptional errors that result from this process are unintentional.

## 2019-09-08 NOTE — Assessment & Plan Note (Signed)
Chronic problem but stable.  Making some progress.  Responding well to manipulation.  Follow-up with me again in 8 to 12 weeks

## 2019-09-08 NOTE — Patient Instructions (Addendum)
Good to see you.  I am so impressed! Keep it all up  Enjoy the road trip  See me again in 6-8 weeks

## 2019-10-19 NOTE — Progress Notes (Deleted)
  [name] [address] [place] [address] Phone: [phone] Subjective:    I'm seeing this patient by the request  of:  McLean-Scocuzza, [name], MD  CC:   [id number]  Autumn [name] is a 34 y.o. female coming in with complaint of back and neck pain. OMT 09/08/2019. Patient states   Medications patient has been prescribed:   Taking:         Reviewed prior external information including notes and imaging from previsou exam, outside providers and external EMR if available.   As well as notes that were available from care everywhere and other healthcare systems.  Past medical history, social, surgical and family history all reviewed in electronic medical record.  No pertanent information unless stated regarding to the chief complaint.   Past Medical History:  Diagnosis Date  . Allergy   . Arthritis   . GERD (gastroesophageal reflux disease)   . Heart palpitations    years ago thought 2/2 stress did not see cardiology was on amitriptyline per pt   . History of chicken pox   . Hx of migraines     Allergies  Allergen Reactions  . Other     Dogs, cats, dust mites and mold       Review of Systems:  No headache, visual changes, nausea, vomiting, diarrhea, constipation, dizziness, abdominal pain, skin rash, fevers, chills, night sweats, weight loss, swollen lymph nodes, body aches, joint swelling, chest pain, shortness of breath, mood changes. POSITIVE muscle aches  Objective  There were no vitals taken for this visit.   General: No apparent distress alert and oriented x3 mood and affect normal, dressed appropriately.  HEENT: Pupils equal, extraocular movements intact  Respiratory: Patient's speak in full sentences and does not appear short of breath  Cardiovascular: No lower extremity edema, non tender, no erythema  Neuro: Cranial nerves II through XII are intact, neurovascularly intact in all extremities with 2+ DTRs and 2+  pulses.  Gait normal with good balance and coordination.  MSK:  Non tender with full range of motion and good stability and symmetric strength and tone of shoulders, elbows, wrist, hip, knee and ankles bilaterally.  Back - Normal skin, Spine with normal alignment and no deformity.  No tenderness to vertebral process palpation.  Paraspinous muscles are not tender and without spasm.   Range of motion is full at neck and lumbar sacral regions  Osteopathic findings  C2 flexed rotated and side bent right C6 flexed rotated and side bent left T3 extended rotated and side bent right inhaled rib T9 extended rotated and side bent left L2 flexed rotated and side bent right Sacrum right on right       Assessment and Plan:    Nonallopathic problems  Decision today to treat with OMT was based on Physical Exam  After verbal consent patient was treated with HVLA, ME, FPR techniques in cervical, rib, thoracic, lumbar, and sacral  areas  Patient tolerated the procedure well with improvement in symptoms  Patient given exercises, stretches and lifestyle modifications  See medications in patient instructions if given  Patient will follow up in 4-8 weeks      The above documentation has been reviewed and is accurate and complete Wilford Grist       Note: This dictation was prepared with Dragon dictation along with smaller phrase technology. Any transcriptional errors that result from this process are unintentional.

## 2019-10-20 ENCOUNTER — Ambulatory Visit: Payer: 59 | Admitting: Family Medicine

## 2019-10-21 NOTE — Progress Notes (Signed)
[name] [address] [place] [address] Phone: [phone] Subjective:   Autumn [name], am serving as a scribe for Dr. Antoine Primas.  This visit occurred during the SARS-CoV-2 public health emergency.  Safety protocols were in place, including screening questions prior to the visit, additional usage of staff PPE, and extensive cleaning of exam room while observing appropriate contact time as indicated for disinfecting solutions.   I'm seeing this patient by the request  of:  McLean-Scocuzza, [name], MD  CC: Neck and back pain follow-up  [id number]  Autumn [name] is a 34 y.o. female coming in with complaint of back and neck pain. Patient suffers from migraines. OMT 09/08/2019. Patient states that she has had a lot of tension and pain on the right side of her neck around her jaw for the past 2 weeks. States she has somewhat improved.  Patient has been little bit more stressed.  Patient's child has started kindergarten and it is not going smoothly.          Reviewed prior external information including notes and imaging from previsou exam, outside providers and external EMR if available.   As well as notes that were available from care everywhere and other healthcare systems.  Past medical history, social, surgical and family history all reviewed in electronic medical record.  No pertanent information unless stated regarding to the chief complaint.   Past Medical History:  Diagnosis Date  . Allergy   . Arthritis   . GERD (gastroesophageal reflux disease)   . Heart palpitations    years ago thought 2/2 stress did not see cardiology was on amitriptyline per pt   . History of chicken pox   . Hx of migraines     Allergies  Allergen Reactions  . Other     Dogs, cats, dust mites and mold       Review of Systems:  No headache, visual changes, nausea, vomiting, diarrhea, constipation, dizziness, abdominal pain, skin rash, fevers,  chills, night sweats, weight loss, swollen lymph nodes, body aches, joint swelling, chest pain, shortness of breath, mood changes. POSITIVE muscle aches  Objective  Blood pressure 102/72, pulse 71, height 5\' 4"  (1.626 m), weight 129 lb (58.5 kg), SpO2 99 %.   General: No apparent distress alert and oriented x3 mood and affect normal, dressed appropriately.  HEENT: Pupils equal, extraocular movements intact  Respiratory: Patient's speak in full sentences and does not appear short of breath  Cardiovascular: No lower extremity edema, non tender, no erythema  Neuro: Cranial nerves II through XII are intact, neurovascularly intact in all extremities with 2+ DTRs and 2+ pulses.  Gait normal with good balance and coordination.  MSK:  Non tender with full range of motion and good stability and symmetric strength and tone of shoulders, elbows, wrist, hip, knee and ankles bilaterally.  Neck exam does have some loss of lordosis, patient does have some tightness noted in the occipital region right greater than left.  Osteopathic findings  C2 flexed rotated and side bent right C6 flexed rotated and side bent left T3 extended rotated and side bent right inhaled rib T9 extended rotated and side bent left L2 flexed rotated and side bent right Sacrum right on right       Assessment and Plan:  Migraine Patient is seen me for more of headaches and cervicogenic previously.  I do believe that patient could be grinding her teeth at night and we discussed a mouthguard.  Patient otherwise is not taking any medications, mild exacerbation of underlying problem that is responding fairly well to manipulation.  Follow-up with me again in 6 to 8 weeks    Nonallopathic problems  Decision today to treat with OMT was based on Physical Exam  After verbal consent patient was treated with HVLA, ME, FPR techniques in cervical, rib  areas  Patient tolerated the procedure well with improvement in  symptoms  Patient given exercises, stretches and lifestyle modifications  See medications in patient instructions if given  Patient will follow up in 4-8 weeks      The above documentation has been reviewed and is accurate and complete Judi Saa, DO       Note: This dictation was prepared with Dragon dictation along with smaller phrase technology. Any transcriptional errors that result from this process are unintentional.

## 2019-10-23 ENCOUNTER — Encounter: Payer: Self-pay | Admitting: Family Medicine

## 2019-10-23 ENCOUNTER — Other Ambulatory Visit: Payer: Self-pay

## 2019-10-23 ENCOUNTER — Ambulatory Visit (INDEPENDENT_AMBULATORY_CARE_PROVIDER_SITE_OTHER): Payer: 59 | Admitting: Family Medicine

## 2019-10-23 VITALS — BP 102/72 | HR 71 | Ht 64.0 in | Wt 129.0 lb

## 2019-10-23 DIAGNOSIS — M999 Biomechanical lesion, unspecified: Secondary | ICD-10-CM | POA: Diagnosis not present

## 2019-10-23 DIAGNOSIS — G43719 Chronic migraine without aura, intractable, without status migrainosus: Secondary | ICD-10-CM | POA: Diagnosis not present

## 2019-10-23 NOTE — Assessment & Plan Note (Signed)
Patient is seen me for more of headaches and cervicogenic previously.  I do believe that patient could be grinding her teeth at night and we discussed a mouthguard.  Patient otherwise is not taking any medications, mild exacerbation of underlying problem that is responding fairly well to manipulation.  Follow-up with me again in 6 to 8 weeks

## 2019-10-23 NOTE — Patient Instructions (Signed)
Mouth guard at night Make sure you keep your routine when you can  Exercise See me again in 5-6 weeks

## 2019-11-27 ENCOUNTER — Encounter: Payer: Self-pay | Admitting: Family Medicine

## 2019-11-27 ENCOUNTER — Ambulatory Visit (INDEPENDENT_AMBULATORY_CARE_PROVIDER_SITE_OTHER): Payer: 59 | Admitting: Family Medicine

## 2019-11-27 ENCOUNTER — Other Ambulatory Visit: Payer: Self-pay

## 2019-11-27 VITALS — BP 100/80 | HR 75 | Ht 64.0 in | Wt 128.0 lb

## 2019-11-27 DIAGNOSIS — M999 Biomechanical lesion, unspecified: Secondary | ICD-10-CM | POA: Diagnosis not present

## 2019-11-27 DIAGNOSIS — G43719 Chronic migraine without aura, intractable, without status migrainosus: Secondary | ICD-10-CM

## 2019-11-27 NOTE — Assessment & Plan Note (Signed)
Patient is doing much better at this time.  Still has some increasing in stress and we discussed different ergonomic changes.  We also discussed not grinding the teeth as much.  Patient will continue to work on the different ergonomic changes throughout the day.  Follow-up again in 4 to 8 weeks

## 2019-11-27 NOTE — Progress Notes (Signed)
[name] [address] [place] [address] Phone: [phone] Subjective:   I Autumn [name] am serving as a Neurosurgeon for Dr. Antoine [name].  This visit occurred during the SARS-CoV-2 public health emergency.  Safety protocols were in place, including screening questions prior to the visit, additional usage of staff PPE, and extensive cleaning of exam room while observing appropriate contact time as indicated for disinfecting solutions.   I'm seeing this patient by the request  of:  McLean-Scocuzza, [name], MD  CC: Neck and back pain follow-up  [id number]  Autumn [name] is a 33 y.o. female coming in with complaint of back and neck pain. OMT 10/23/2019. Patient states she is doing well. States she has been better the last few weeks.  Patient states overall has done better.  Trying to do the massage therapy a little more frequently and she thinks this is helping.  Medications patient has been prescribed: Vit D  Taking: Yes         Reviewed prior external information including notes and imaging from previsou exam, outside providers and external EMR if available.   As well as notes that were available from care everywhere and other healthcare systems.  Past medical history, social, surgical and family history all reviewed in electronic medical record.  No pertanent information unless stated regarding to the chief complaint.   Past Medical History:  Diagnosis Date  . Allergy   . Arthritis   . GERD (gastroesophageal reflux disease)   . Heart palpitations    years ago thought 2/2 stress did not see cardiology was on amitriptyline per pt   . History of chicken pox   . Hx of migraines     Allergies  Allergen Reactions  . Other     Dogs, cats, dust mites and mold       Review of Systems:  No headache, visual changes, nausea, vomiting, diarrhea, constipation, dizziness, abdominal pain, skin rash, fevers, chills, night sweats, weight loss,  swollen lymph nodes, body aches, joint swelling, chest pain, shortness of breath, mood changes. POSITIVE muscle aches  Objective  Blood pressure 100/80, pulse 75, height 5\' 4"  (1.626 m), weight 128 lb (58.1 kg), SpO2 98 %.   General: No apparent distress alert and oriented x3 mood and affect normal, dressed appropriately.  HEENT: Pupils equal, extraocular movements intact  Respiratory: Patient's speak in full sentences and does not appear short of breath  Cardiovascular: No lower extremity edema, non tender, no erythema  Neuro: Cranial nerves II through XII are intact, neurovascularly intact in all extremities with 2+ DTRs and 2+ pulses.  Gait normal with good balance and coordination.  MSK:  Non tender with full range of motion and good stability and symmetric strength and tone of shoulders, elbows, wrist, hip, knee and ankles bilaterally.  Upper back still shows some mild tightness between the scapulas.  Neck does have some very mild tightness with side bending left greater than right.  Osteopathic findings  C2 flexed rotated and side bent right C7 flexed rotated and side bent left T3 extended rotated and side bent right inhaled rib T9 extended rotated and side bent left        Assessment and Plan: Migraine Patient is doing much better at this time.  Still has some increasing in stress and we discussed different ergonomic changes.  We also discussed not grinding the teeth as much.  Patient will continue to work on the different ergonomic changes throughout the day.  Follow-up again in 4 to 8 weeks    Nonallopathic problems  Decision today to treat with OMT was based on Physical Exam  After verbal consent patient was treated with HVLA, ME, FPR techniques in cervical, rib, thoracic  areas  Patient tolerated the procedure well with improvement in symptoms  Patient given exercises, stretches and lifestyle modifications  See medications in patient instructions if  given  Patient will follow up in 4-8 weeks      The above documentation has been reviewed and is accurate and complete Autumn Saa, DO       Note: This dictation was prepared with Dragon dictation along with smaller phrase technology. Any transcriptional errors that result from this process are unintentional.

## 2019-11-27 NOTE — Patient Instructions (Signed)
Keep it up! Much better! See me again in 8 weeks

## 2020-01-22 ENCOUNTER — Ambulatory Visit: Payer: 59 | Admitting: Family Medicine

## 2020-02-15 NOTE — Progress Notes (Signed)
[name] [address] [place] [address] Phone: [phone] Subjective:   Autumn [name], am serving as a scribe for Dr. Antoine Primas. This visit occurred during the SARS-CoV-2 public health emergency.  Safety protocols were in place, including screening questions prior to the visit, additional usage of staff PPE, and extensive cleaning of exam room while observing appropriate contact time as indicated for disinfecting solutions.   I'm seeing this patient by the request  of:  McLean-Scocuzza, [name], MD  CC: neck and upper back pain follow-up  [id number]  Autumn [name] is a 34 y.o. female coming in with complaint of back and neck pain. OMT 11/27/2019. Patient states that she has not had any issues.  Patient has been very active overall.  Mild tightness here there.  Does feel that stress in her little bit.  Medications patient has been prescribed: Vit D  Taking: Yes         Reviewed prior external information including notes and imaging from previsou exam, outside providers and external EMR if available.   As well as notes that were available from care everywhere and other healthcare systems.  Past medical history, social, surgical and family history all reviewed in electronic medical record.  No pertanent information unless stated regarding to the chief complaint.   Past Medical History:  Diagnosis Date  . Allergy   . Arthritis   . GERD (gastroesophageal reflux disease)   . Heart palpitations    years ago thought 2/2 stress did not see cardiology was on amitriptyline per pt   . History of chicken pox   . Hx of migraines     Allergies  Allergen Reactions  . Other     Dogs, cats, dust mites and mold       Review of Systems:  No  visual changes, nausea, vomiting, diarrhea, constipation, dizziness, abdominal pain, skin rash, fevers, chills, night sweats, weight loss, swollen lymph nodes, body aches, joint swelling, chest  pain, shortness of breath, mood changes. POSITIVE muscle aches, headache  Objective  Blood pressure 112/78, pulse 79, height 5\' 4"  (1.626 m), weight 130 lb (59 kg), SpO2 98 %.   General: No apparent distress alert and oriented x3 mood and affect normal, dressed appropriately.  HEENT: Pupils equal, extraocular movements intact  Respiratory: Patient's speak in full sentences and does not appear short of breath  Cardiovascular: No lower extremity edema, non tender, no erythema  Gait normal with good balance and coordination.  MSK:  Non tender with full range of motion and good stability and symmetric strength and tone of shoulders, elbows, wrist, hip, knee and ankles bilaterally.  Neck exam very mild lower tightness noted left greater than right.  Patient does have some mild decrease in sidebending.  Mild pain in the parascapular region left greater than right  Osteopathic findings  C6 flexed rotated and side bent left T3 extended rotated and side bent right inhaled rib T9 extended rotated and side bent left     Assessment and Plan:  Migraine Patient has been doing remarkably better at this time.  No significant change in management.  Patient is having very minimal headaches.  Responding well.  Follow-up again in 2 to 3 months    Nonallopathic problems  Decision today to treat with OMT was based on Physical Exam  After verbal consent patient was treated with HVLA, ME, FPR techniques in cervical, rib, thoracic areas  Patient tolerated the procedure well with improvement in  symptoms  Patient given exercises, stretches and lifestyle modifications  See medications in patient instructions if given   Patient will follow up in 8-12 weeks      The above documentation has been reviewed and is accurate and complete Judi Saa, DO       Note: This dictation was prepared with Dragon dictation along with smaller phrase technology. Any transcriptional errors that result from this  process are unintentional.

## 2020-02-16 ENCOUNTER — Other Ambulatory Visit: Payer: Self-pay

## 2020-02-16 ENCOUNTER — Ambulatory Visit (INDEPENDENT_AMBULATORY_CARE_PROVIDER_SITE_OTHER): Payer: 59 | Admitting: Family Medicine

## 2020-02-16 ENCOUNTER — Encounter: Payer: Self-pay | Admitting: Family Medicine

## 2020-02-16 VITALS — BP 112/78 | HR 79 | Ht 64.0 in | Wt 130.0 lb

## 2020-02-16 DIAGNOSIS — G43719 Chronic migraine without aura, intractable, without status migrainosus: Secondary | ICD-10-CM

## 2020-02-16 DIAGNOSIS — M999 Biomechanical lesion, unspecified: Secondary | ICD-10-CM | POA: Diagnosis not present

## 2020-02-16 NOTE — Assessment & Plan Note (Signed)
Patient has been doing remarkably better at this time.  No significant change in management.  Patient is having very minimal headaches.  Responding well.  Follow-up again in 2 to 3 months

## 2020-02-16 NOTE — Patient Instructions (Signed)
Not bad Stretch hip flexor after activity See me 10-12 weeks

## 2020-03-11 ENCOUNTER — Other Ambulatory Visit: Payer: Self-pay | Admitting: Family Medicine

## 2020-03-23 ENCOUNTER — Encounter: Payer: 59 | Admitting: Internal Medicine

## 2020-03-28 ENCOUNTER — Other Ambulatory Visit: Payer: Self-pay | Admitting: Family Medicine

## 2020-04-21 NOTE — Progress Notes (Signed)
[name] [address] [place] [address] Phone: [phone] Subjective:   I [name] am serving as a Neurosurgeon for Dr. Antoine Primas.  This visit occurred during the SARS-CoV-2 public health emergency.  Safety protocols were in place, including screening questions prior to the visit, additional usage of staff PPE, and extensive cleaning of exam room while observing appropriate contact time as indicated for disinfecting solutions.   I'm seeing this patient by the request  of:  McLean-Scocuzza, [name], MD  CC: Neck pain follow-up  [id number]  Autumn [name] is a 35 y.o. female coming in with complaint of back and neck pain. OMT 02/16/2020. Patient states she has had pain lately. States she has been tight in her left shoulder. Massage yesterday so it is a little better. Has been sometime since we have seen patient.  Medications patient has been prescribed: Vit D          Reviewed prior external information including notes and imaging from previsou exam, outside providers and external EMR if available.   As well as notes that were available from care everywhere and other healthcare systems.  Past medical history, social, surgical and family history all reviewed in electronic medical record.  No pertanent information unless stated regarding to the chief complaint.   Past Medical History:  Diagnosis Date  . Allergy   . Arthritis   . GERD (gastroesophageal reflux disease)   . Heart palpitations    years ago thought 2/2 stress did not see cardiology was on amitriptyline per pt   . History of chicken pox   . Hx of migraines     Allergies  Allergen Reactions  . Other     Dogs, cats, dust mites and mold       Review of Systems:  No headache, visual changes, nausea, vomiting, diarrhea, constipation, dizziness, abdominal pain, skin rash, fevers, chills, night sweats, weight loss, swollen lymph nodes, body aches, joint swelling, chest  pain, shortness of breath, mood changes. POSITIVE muscle aches  Objective  Blood pressure 90/70, pulse 76, height 5\' 4"  (1.626 m), weight 133 lb (60.3 kg), SpO2 98 %.   General: No apparent distress alert and oriented x3 mood and affect normal, dressed appropriately.  HEENT: Pupils equal, extraocular movements intact  Respiratory: Patient's speak in full sentences and does not appear short of breath  Cardiovascular: No lower extremity edema, non tender, no erythema  Gait normal with good balance and coordination.  MSK:  Non tender with full range of motion and good stability and symmetric strength and tone of shoulders, elbows, wrist, hip, knee and ankles bilaterally.  Neck exam doing relatively well. Does have some tightness noted in the paraspinal musculature of the parascapular area. Negative Spurling's of the neck. Patient has 5 out of 5 strength of the upper extremity and some of the lower extremity.  Osteopathic findings  C2 flexed rotated and side bent right C6 flexed rotated and side bent left T4 extended rotated and side bent right inhaled rib T5 extended rotated and side bent left      Assessment and Plan:  Migraine Some cervicogenic headache. Some tightness noted in the neck. Patient did have a recent severe injury with a falling branch but is doing okay. Patient will follow up with me again in 2 to 3 months.    Nonallopathic problems  Decision today to treat with OMT was based on Physical Exam  After verbal consent patient was treated with HVLA,  ME, FPR techniques in cervical, rib, thoracic  areas  Patient tolerated the procedure well with improvement in symptoms  Patient given exercises, stretches and lifestyle modifications  See medications in patient instructions if given  Patient will follow up in 8-12 weeks      The above documentation has been reviewed and is accurate and complete Judi Saa, DO       Note: This dictation was prepared with  Dragon dictation along with smaller phrase technology. Any transcriptional errors that result from this process are unintentional.

## 2020-04-22 ENCOUNTER — Encounter: Payer: Self-pay | Admitting: Family Medicine

## 2020-04-22 ENCOUNTER — Ambulatory Visit (INDEPENDENT_AMBULATORY_CARE_PROVIDER_SITE_OTHER): Payer: 59 | Admitting: Family Medicine

## 2020-04-22 ENCOUNTER — Other Ambulatory Visit: Payer: Self-pay

## 2020-04-22 VITALS — BP 90/70 | HR 76 | Ht 64.0 in | Wt 133.0 lb

## 2020-04-22 DIAGNOSIS — G43719 Chronic migraine without aura, intractable, without status migrainosus: Secondary | ICD-10-CM | POA: Diagnosis not present

## 2020-04-22 DIAGNOSIS — M999 Biomechanical lesion, unspecified: Secondary | ICD-10-CM | POA: Diagnosis not present

## 2020-04-22 NOTE — Patient Instructions (Addendum)
Good to see you Glad you are ok See me again in 8 weeks

## 2020-04-22 NOTE — Assessment & Plan Note (Signed)
Some cervicogenic headache. Some tightness noted in the neck. Patient did have a recent severe injury with a falling branch but is doing okay. Patient will follow up with me again in 2 to 3 months.

## 2020-04-26 ENCOUNTER — Ambulatory Visit: Payer: 59 | Admitting: Family Medicine

## 2020-04-27 ENCOUNTER — Other Ambulatory Visit: Payer: Self-pay

## 2020-04-27 ENCOUNTER — Encounter: Payer: Self-pay | Admitting: Internal Medicine

## 2020-04-27 ENCOUNTER — Ambulatory Visit (INDEPENDENT_AMBULATORY_CARE_PROVIDER_SITE_OTHER): Payer: 59 | Admitting: Internal Medicine

## 2020-04-27 VITALS — BP 118/74 | HR 89 | Temp 97.9°F | Ht 63.94 in | Wt 130.8 lb

## 2020-04-27 DIAGNOSIS — R102 Pelvic and perineal pain unspecified side: Secondary | ICD-10-CM

## 2020-04-27 DIAGNOSIS — J309 Allergic rhinitis, unspecified: Secondary | ICD-10-CM | POA: Diagnosis not present

## 2020-04-27 DIAGNOSIS — N393 Stress incontinence (female) (male): Secondary | ICD-10-CM

## 2020-04-27 DIAGNOSIS — Z0000 Encounter for general adult medical examination without abnormal findings: Secondary | ICD-10-CM

## 2020-04-27 DIAGNOSIS — R110 Nausea: Secondary | ICD-10-CM

## 2020-04-27 DIAGNOSIS — E559 Vitamin D deficiency, unspecified: Secondary | ICD-10-CM | POA: Diagnosis not present

## 2020-04-27 LAB — CBC WITH DIFFERENTIAL/PLATELET
Basophils Absolute: 0 10*3/uL (ref 0.0–0.1)
Basophils Relative: 0.5 % (ref 0.0–3.0)
Eosinophils Absolute: 0.1 10*3/uL (ref 0.0–0.7)
Eosinophils Relative: 1.3 % (ref 0.0–5.0)
HCT: 39.1 % (ref 36.0–46.0)
Hemoglobin: 13.4 g/dL (ref 12.0–15.0)
Lymphocytes Relative: 24.9 % (ref 12.0–46.0)
Lymphs Abs: 2.2 10*3/uL (ref 0.7–4.0)
MCHC: 34.3 g/dL (ref 30.0–36.0)
MCV: 93.8 fl (ref 78.0–100.0)
Monocytes Absolute: 0.4 10*3/uL (ref 0.1–1.0)
Monocytes Relative: 5.1 % (ref 3.0–12.0)
Neutro Abs: 5.9 10*3/uL (ref 1.4–7.7)
Neutrophils Relative %: 68.2 % (ref 43.0–77.0)
Platelets: 201 10*3/uL (ref 150.0–400.0)
RBC: 4.17 Mil/uL (ref 3.87–5.11)
RDW: 13 % (ref 11.5–15.5)
WBC: 8.7 10*3/uL (ref 4.0–10.5)

## 2020-04-27 LAB — COMPREHENSIVE METABOLIC PANEL
ALT: 16 U/L (ref 0–35)
AST: 18 U/L (ref 0–37)
Albumin: 4.5 g/dL (ref 3.5–5.2)
Alkaline Phosphatase: 55 U/L (ref 39–117)
BUN: 14 mg/dL (ref 6–23)
CO2: 29 mEq/L (ref 19–32)
Calcium: 9.7 mg/dL (ref 8.4–10.5)
Chloride: 103 mEq/L (ref 96–112)
Creatinine, Ser: 0.84 mg/dL (ref 0.40–1.20)
GFR: 89.15 mL/min (ref >60.00)
Glucose, Bld: 91 mg/dL (ref 70–99)
Potassium: 3.7 mEq/L (ref 3.5–5.1)
Sodium: 138 mEq/L (ref 135–145)
Total Bilirubin: 0.8 mg/dL (ref 0.2–1.2)
Total Protein: 7 g/dL (ref 6.0–8.3)

## 2020-04-27 LAB — LIPID PANEL
Cholesterol: 164 mg/dL (ref 0–200)
HDL: 80.6 mg/dL (ref >39.00)
LDL Cholesterol: 76 mg/dL (ref 0–99)
NonHDL: 83.25
Total CHOL/HDL Ratio: 2
Triglycerides: 35 mg/dL (ref 0.0–149.0)
VLDL: 7 mg/dL (ref 0.0–40.0)

## 2020-04-27 LAB — TSH: TSH: 1.24 u[IU]/mL (ref 0.35–4.50)

## 2020-04-27 LAB — VITAMIN D 25 HYDROXY (VIT D DEFICIENCY, FRACTURES): VITD: 39.91 ng/mL (ref 30.00–100.00)

## 2020-04-27 MED ORDER — ONDANSETRON HCL 4 MG PO TABS
4.0000 mg | ORAL_TABLET | Freq: Three times a day (TID) | ORAL | 11 refills | Status: DC | PRN
Start: 2020-04-27 — End: 2021-04-28

## 2020-04-27 MED ORDER — CETIRIZINE HCL 10 MG PO TABS: 10.0000 mg | ORAL_TABLET | Freq: Every day | ORAL | 3 refills | Status: DC | PRN

## 2020-04-27 MED ORDER — MONTELUKAST SODIUM 10 MG PO TABS: 10.0000 mg | ORAL_TABLET | Freq: Every day | ORAL | 3 refills | Status: DC

## 2020-04-27 MED ORDER — FLUTICASONE PROPIONATE 50 MCG/ACT NA SUSP
2.0000 | Freq: Every day | NASAL | 11 refills | Status: DC | PRN
Start: 2020-04-27 — End: 2021-04-28

## 2020-04-27 NOTE — Patient Instructions (Addendum)
Pelvic PT Mariane Masters for stress urinary incontinence and pelvic pain 1-2 x per week after 5:30 pm if able  [phone]  Urinary Incontinence  Urinary incontinence refers to a condition in which a person is unable to control where and when to pass urine. A person with this condition will urinate when he or she does not mean to (involuntarily). What are the causes? This condition may be caused by:  Medicines.  Infections.  Constipation.  Overactive bladder muscles.  Weak bladder muscles.  Weak pelvic floor muscles. These muscles provide support for the bladder, intestine, and, in women, the uterus.  Enlarged prostate in men. The prostate is a gland near the bladder. When it gets too big, it can pinch the urethra. With the urethra blocked, the bladder can weaken and lose the ability to empty properly.  Surgery.  Emotional factors, such as anxiety, stress, or post-traumatic stress disorder (PTSD).  Pelvic organ prolapse. This happens in women when organs shift out of place and into the vagina. This shift can prevent the bladder and urethra from working properly. What increases the risk? The following factors may make you more likely to develop this condition:  Older age.  Obesity and physical inactivity.  Pregnancy and childbirth.  Menopause.  Diseases that affect the nerves or spinal cord (neurological diseases).  Long-term (chronic) coughing. This can increase pressure on the bladder and pelvic floor muscles. What are the signs or symptoms? Symptoms may vary depending on the type of urinary incontinence you have. They include:  A sudden urge to urinate, but passing urine involuntarily before you can get to a bathroom (urge incontinence).  Suddenly passing urine with any activity that forces urine to pass, such as coughing, laughing, exercise, or sneezing (stress incontinence).  Needing to urinate often, but urinating only a small amount, or constantly dribbling  urine (overflow incontinence).  Urinating because you cannot get to the bathroom in time due to a physical disability, such as arthritis or injury, or communication and thinking problems, such as Alzheimer disease (functional incontinence). How is this diagnosed? This condition may be diagnosed based on:  Your medical history.  A physical exam.  Tests, such as: ? Urine tests. ? X-rays of your kidney and bladder. ? Ultrasound. ? CT scan. ? Cystoscopy. In this procedure, a health care provider inserts a tube with a light and camera (cystoscope) through the urethra and into the bladder in order to check for problems. ? Urodynamic testing. These tests assess how well the bladder, urethra, and sphincter can store and release urine. There are different types of urodynamic tests, and they vary depending on what the test is measuring. To help diagnose your condition, your health care provider may recommend that you keep a log of when you urinate and how much you urinate. How is this treated? Treatment for this condition depends on the type of incontinence that you have and its cause. Treatment may include:  Lifestyle changes, such as: ? Quitting smoking. ? Maintaining a healthy weight. ? Staying active. Try to get 150 minutes of moderate-intensity exercise every week. Ask your health care provider which activities are safe for you. ? Eating a healthy diet.  Avoid high-fat foods, like fried foods.  Avoid refined carbohydrates like white bread and white rice.  Limit how much alcohol and caffeine you drink.  Increase your fiber intake. Foods such as fresh fruits, vegetables, beans, and whole grains are healthy sources of fiber.  Pelvic floor muscle exercises.  Bladder  training, such as lengthening the amount of time between bathroom breaks, or using the bathroom at regular intervals.  Using techniques to suppress bladder urges. This can include distraction techniques or controlled  breathing exercises.  Medicines to relax the bladder muscles and prevent bladder spasms.  Medicines to help slow or prevent the growth of a man's prostate.  Botox injections. These can help relax the bladder muscles.  Using pulses of electricity to help change bladder reflexes (electrical nerve stimulation).  For women, using a medical device to prevent urine leaks. This is a small, tampon-like, disposable device that is inserted into the urethra.  Injecting collagen or carbon beads (bulking agents) into the urinary sphincter. These can help thicken tissue and close the bladder opening.  Surgery. Follow these instructions at home: Lifestyle  Limit alcohol and caffeine. These can fill your bladder quickly and irritate it.  Keep yourself clean to help prevent odors and skin damage. Ask your doctor about special skin creams and cleansers that can protect the skin from urine.  Consider wearing pads or adult diapers. Make sure to change them regularly, and always change them right after experiencing incontinence. General instructions  Take over-the-counter and prescription medicines only as told by your health care provider.  Use the bathroom about every 3-4 hours, even if you do not feel the need to urinate. Try to empty your bladder completely every time. After urinating, wait a minute. Then try to urinate again.  Make sure you are in a relaxed position while urinating.  If your incontinence is caused by nerve problems, keep a log of the medicines you take and the times you go to the bathroom.  Keep all follow-up visits as told by your health care provider. This is important. Contact a health care provider if:  You have pain that gets worse.  Your incontinence gets worse. Get help right away if:  You have a fever or chills.  You are unable to urinate.  You have redness in your groin area or down your legs. Summary  Urinary incontinence refers to a condition in which a  person is unable to control where and when to pass urine.  This condition may be caused by medicines, infection, weak bladder muscles, weak pelvic floor muscles, enlargement of the prostate (in men), or surgery.  The following factors increase your risk for developing this condition: older age, obesity, pregnancy and childbirth, menopause, neurological diseases, and chronic coughing.  There are several types of urinary incontinence. They include urge incontinence, stress incontinence, overflow incontinence, and functional incontinence.  This condition is usually treated first with lifestyle and behavioral changes, such as quitting smoking, eating a healthier diet, and doing regular pelvic floor exercises. Other treatment options include medicines, bulking agents, medical devices, electrical nerve stimulation, or surgery. This information is not intended to replace advice given to you by your health care provider. Make sure you discuss any questions you have with your health care provider. Document Revised: 02/15/2017 Document Reviewed: 05/17/2016 Elsevier Patient Education  2021 [employer].

## 2020-04-27 NOTE — Progress Notes (Signed)
Chief Complaint  Patient presents with  . Annual Exam  . Abdominal Pain    Pressure in the lower abdomen ongoing for a month. States sometimes after urinating she will feel like she still has to go. No other urinary symptoms, no constipation.  IUD removed in August 2021.   . Medication Refill    Zyrtec and Singulair    Annual  1. Lower ab pain/pressure x 1 month with increased freq/bladder stress incontinence with 2 vaginal births 9 lb baby 1 of them and h/o diastasis recti and h/o BV agreeable pelvic PT  2. Migraines controlled needs refills of meds    Review of Systems  Constitutional: Negative for weight loss.  HENT: Negative for hearing loss.   Eyes: Negative for blurred vision.  Respiratory: Negative for shortness of breath.   Cardiovascular: Negative for chest pain.  Gastrointestinal: Positive for abdominal pain.  Musculoskeletal: Negative for falls.  Skin: Negative for rash.  Neurological: Negative for headaches.  Psychiatric/Behavioral: Negative for depression.   Past Medical History:  Diagnosis Date  . Allergy   . Arthritis   . GERD (gastroesophageal reflux disease)   . Heart palpitations    years ago thought 2/2 stress did not see cardiology was on amitriptyline per pt   . History of chicken pox   . Hx of migraines    Past Surgical History:  Procedure Laterality Date  . TONSILLECTOMY     2001   Family History  Problem Relation Age of Onset  . Depression Mother   . Early death Mother        36  . Heart disease Mother        MVP s/p repair and valve infected   . Learning disabilities Mother   . Stroke Mother   . Cancer Mother        skin  . Alcohol abuse Father   . Hyperlipidemia Father   . Hypertension Father   . Learning disabilities Father   . Learning disabilities Sister   . Depression Sister   . Cancer Maternal Aunt        skin  . Cancer Maternal Uncle        skin  . COPD Maternal Grandmother   . Diabetes Maternal Grandmother   . Early  death Maternal Grandfather   . Heart disease Maternal Grandfather        MI age 80   . Alcohol abuse Paternal Grandmother   . Cancer Paternal Grandmother        throat   . COPD Paternal Grandmother   . Early death Paternal Grandmother   . Alcohol abuse Paternal Grandfather   . COPD Paternal Grandfather    Social History   Socioeconomic History  . Marital status: Married    Spouse name: Not on file  . Number of children: Not on file  . Years of education: Not on file  . Highest education level: Not on file  Occupational History  . Not on file  Tobacco Use  . Smoking status: Former Research scientist (life sciences)  . Smokeless tobacco: Never Used  . Tobacco comment: smoked x 10 years on and off 5 cig per day   Substance and Sexual Activity  . Alcohol use: Yes    Comment: rare   . Drug use: Not Currently  . Sexual activity: Yes  Other Topics Concern  . 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 Butalbital-APAP-Caffeine (FIORICET) 50-300-40 MG CAPS Take 1-2 tablets by mouth every 4 (four) hours as needed.  . promethazine (PHENERGAN) 25 MG tablet Take 1 tablet (25 mg total) by mouth 2 (two) times daily as needed for nausea or vomiting.  . SUMAtriptan (IMITREX) 100 MG tablet   . Vitamin D, Ergocalciferol, (DRISDOL) 1.25 MG (50000 UNIT) CAPS capsule TAKE 1 CAPSULE BY MOUTH EVERY 7 DAYS  . [DISCONTINUED] cetirizine (ZYRTEC) 10 MG tablet Take 1 tablet (10 mg total) by mouth daily as needed for allergies.  . [DISCONTINUED] fluticasone (FLONASE) 50 MCG/ACT nasal spray Place 2 sprays into both  nostrils daily.  . [DISCONTINUED] montelukast (SINGULAIR) 10 MG tablet Take 1 tablet (10 mg total) by mouth at bedtime.  . [DISCONTINUED] ondansetron (ZOFRAN) 4 MG tablet Take 4 mg by mouth every 8 (eight) hours as needed for nausea or vomiting.   Allergies  Allergen Reactions  . Other     Dogs, cats, dust mites and mold     No results found for this or any previous visit (from the past 2160 hour(s)). Objective  Body mass index is 22.5 kg/m. Wt Readings from Last 3 Encounters:  04/27/20 130 lb 12.8 oz (59.3 kg)  04/22/20 133 lb (60.3 kg)  02/16/20 130 lb (59 kg)   Temp Readings from Last 3 Encounters:  04/27/20 97.9 F (36.6 C) (Oral)  03/24/19 (!) 97 F (36.1 C) (Temporal)  05/01/18 98.4 F (36.9 C) (Oral)   BP Readings from Last 3 Encounters:  04/27/20 118/74  04/22/20 90/70  02/16/20 112/78   Pulse Readings from Last 3 Encounters:  04/27/20 89  04/22/20 76  02/16/20 79    Physical Exam Vitals and nursing note reviewed.  Constitutional:      Appearance: Normal appearance. She is well-developed and well-groomed.  HENT:     Head: Normocephalic and atraumatic.  Eyes:     Conjunctiva/sclera: Conjunctivae normal.     Pupils: Pupils are equal, round, and reactive to light.  Cardiovascular:     Rate and Rhythm: Normal rate and regular rhythm.     Heart sounds: Normal heart sounds. No murmur heard.   Pulmonary:     Effort: Pulmonary effort is normal.     Breath sounds: Normal breath sounds.  Chest:     Chest wall: No mass.  Breasts: Breasts are symmetrical.     Right: Normal. No axillary adenopathy.     Left: Normal. No axillary adenopathy.    Abdominal:     Tenderness: There is no abdominal tenderness.  Lymphadenopathy:     Upper Body:     Right upper body: No axillary adenopathy.     Left upper body: No axillary adenopathy.  Skin:    General: Skin is warm and moist.  Neurological:     General: No focal deficit present.     Mental Status: She is  alert and oriented to person, place, and time.     Gait: Gait normal.  Psychiatric:        Attention and Perception: Attention and perception normal.  Mood and Affect: Mood and affect normal.        Speech: Speech normal.        Behavior: Behavior normal. Behavior is cooperative.        Thought Content: Thought content normal.        Cognition and Memory: Cognition and memory normal.        Judgment: Judgment normal.     Assessment  Plan  Annual physical exam - Plan: Comprehensive metabolic panel, Lipid panel, CBC with Differential/Platelet, TSH Last flu shot 2017declines  covid 19 vx declines Tdap utd 03/21/16 MMR immune  Hep B immune  covid declines, hep C declines HIV neg 05/13/13  rec healthy diet and exercise  Former smoker  IUD out 09/2019 -est with Dr. Leonides Schanz 03/24/19 neg neg HPV  Eye MD [facility] 2021/22 vision better rec healthy diet and exercise Dermatology has seen 05/02/2018 shave bx right low back lentiginous junctional nevus   Intractable migraine without status migrainosus, controlled  Disc f/u ob/gyn If menstrual to consider progestin/estrogen oral given list of ob/gyn recs  F/u neurology  Cont prn imitrex, fiorocet, zofran, phenerghan  Pelvic pressure in female - Plan: Urinalysis, Routine w reflex microscopic, Urine Culture, Ambulatory referral to Physical Therapy SUI (stress urinary incontinence, female) - Plan: Ambulatory referral to Physical Therapy F/u with Dr. Leonides Schanz if not better   Allergic rhinitis, unspecified seasonality, unspecified trigger - Plan: montelukast (SINGULAIR) 10 MG tablet, fluticasone (FLONASE) 50 MCG/ACT nasal spray, cetirizine (ZYRTEC) 10 MG tablet  Nausea - Plan: ondansetron (ZOFRAN) 4 MG tablet For migraines   Vitamin D deficiency - Plan: Vitamin D (25 hydroxy)     Provider: Dr. Olivia Mackie McLean-Scocuzza-[facility]

## 2020-04-28 LAB — MICROSCOPIC EXAMINATION
Bacteria, UA: NONE SEEN
Casts: NONE SEEN /lpf
Epithelial Cells (non renal): NONE SEEN /hpf (ref 0–10)
RBC, Urine: NONE SEEN /hpf (ref 0–2)
WBC, UA: NONE SEEN /hpf (ref 0–5)

## 2020-04-28 LAB — URINALYSIS, ROUTINE W REFLEX MICROSCOPIC
Bilirubin, UA: NEGATIVE
Glucose, UA: NEGATIVE
Nitrite, UA: NEGATIVE
Protein,UA: NEGATIVE
RBC, UA: NEGATIVE
Specific Gravity, UA: 1.006 (ref 1.005–1.030)
Urobilinogen, Ur: 0.2 mg/dL (ref 0.2–1.0)
pH, UA: 7 (ref 5.0–7.5)

## 2020-04-29 LAB — URINE CULTURE

## 2020-05-19 ENCOUNTER — Encounter: Payer: Self-pay | Admitting: Physical Therapy

## 2020-05-19 ENCOUNTER — Other Ambulatory Visit: Payer: Self-pay

## 2020-05-19 ENCOUNTER — Ambulatory Visit: Payer: 59 | Attending: Internal Medicine | Admitting: Physical Therapy

## 2020-05-19 DIAGNOSIS — M25512 Pain in left shoulder: Secondary | ICD-10-CM | POA: Insufficient documentation

## 2020-05-19 DIAGNOSIS — M25562 Pain in left knee: Secondary | ICD-10-CM | POA: Diagnosis present

## 2020-05-19 DIAGNOSIS — G8929 Other chronic pain: Secondary | ICD-10-CM | POA: Diagnosis present

## 2020-05-19 DIAGNOSIS — M533 Sacrococcygeal disorders, not elsewhere classified: Secondary | ICD-10-CM | POA: Diagnosis not present

## 2020-05-19 DIAGNOSIS — R278 Other lack of coordination: Secondary | ICD-10-CM | POA: Insufficient documentation

## 2020-05-19 NOTE — Therapy (Signed)
[facility] MAIN [facility] [address] [place], [place], [address] Phone: [phone]   Fax:  [phone]  Physical Therapy Evaluation  Patient Details  Name: Nat ChristenCollins Naeve MRN: [id number] Date of Birth: 1985/11/04 Referring Provider (PT): McLean-Scocuzza, MD   Encounter Date: 05/19/2020   PT End of Session - 05/19/20 1014    Visit Number 1    Number of Visits 10    Date for PT Re-Evaluation 07/28/20    PT Start Time 1010    PT Stop Time 1100    PT Time Calculation (min) 50 min           Past Medical History:  Diagnosis Date  . Allergy   . Arthritis   . GERD (gastroesophageal reflux disease)   . Heart palpitations    years ago thought 2/2 stress did not see cardiology was on amitriptyline per pt   . History of chicken pox   . Hx of migraines     Past Surgical History:  Procedure Laterality Date  . TONSILLECTOMY     2001    There were no vitals filed for this visit.    Subjective Assessment - 05/19/20 1024    Subjective 1) SUI:  Pt had 2 vaginal deliveries with large sized babies. With her first child, she had extreme pubic pain during pregnancy and experienced a 2nd degree perineal tear during delivery . He weighed 9 lb 4.5 oz. After he was born, pt had leakage with sneezing which did not resolve. With her second child who is 37 years old now, pubic pain has been better. Pt did have abdominal separation with her second child. Pt tried exercises for the diastasis recti. Pt has been exercising more lately and noticed leakage with jumping to the point that she also n oticed tampon getting pushed up when at a trampoline park.  Weekly Exercise rotuine: HIIT 2x, Barre 1 x week, Spin 2 x.  Pt performs exercises with lifted head and sit ups and crunches. Pt does stretch at the end of her classes 5-15 min.  Denied prolapse Sx.  Pt reports sometimes she feels she has to go again to urinate even though she just went. Frequency of urination  within every 2hours is 1-2 x.  Denied fecal leakage, constipation, nor strainign with bowel movements. Daily fluid intake: 100 fl oz of water, 2 cups of coffee, no tea/ sodas.   2)  SIJ pain (8/10)  that occurs when sometimes she picks something up or her child.A twinge occurs at the the PSIS ( pt points to this region and L side is more often). "It feels lieke a weakness . Something is aggravated". It occurs every few months and started in her teenage years. Pt has dancing in ballet, cheerleading, modern dance in her youth and currently is Therapist, occupationalBarre instructor. 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   3) L knee pain ( 3-4//10) that is sharp and located on lateral side comes on with walking but eases within 10 min of walking. Pt has to be careful walking down hills. Deos not bother her with other exercises.    4) L shoulder pain for the past 3 month. Pain is under the shoulder blade that comes on and off for years. Pt has been seeing Sports medicine doctor for 4 weeks per year who performed adjustments which helped to improve the pain by 50% . The remaining pain  impacts her sleeping.  Pt sleeps on her belly and on sides. When her shoulder pain is better , her migraines are better. With TRX shoulder roll out exercise, she noticed her shoulders did not feel the same and the strength to hold back up    Pertinent History Dx with arthritis in hips and knees in high school but it does not bother her too mich.    Patient Stated Goals reduce bladder leakage with sneezing and exericising              [facility] PT Assessment - 05/19/20 1046      Assessment   Medical Diagnosis SUI    Referring Provider (PT) McLean-Scocuzza, MD      Precautions   Precautions None      Balance Screen   Has the patient fallen in the past 6 months No      Observation/Other Assessments   Observations crossed legs R over L    Scoliosis L iliac crest higher, L shoulder lowered, R thoracic  convex      Coordination   Coordination and Movement Description delayed downward rotation on R scapula ( post Tx: improved but upper trap still tight, L shoulder still lower)      AROM   Overall AROM Comments L rotation/ sidebend less than R ( post Tx: B)      Strength   Overall Strength Comments B hip abd 4-/5,  R hip flexion 4-/5, L 5/5 with trunk pertubation, knee ext/flex B 5/5      Palpation   Spinal mobility L ASIS/ malleoli higher than R 9 post Tx: l evelled)    SI assessment  L iliac crest higher ( levelled post Tx    Palpation comment tightness at thoracic parspinal R T7-12 area,   R convex curve                     Objective measurements completed on examination: See above findings.     Pelvic Floor Special Questions - 05/19/20 1103    Diastasis Recti neg, softness above umbilicus            [facility] Adult PT Treatment/Exercise - 05/19/20 1106      Therapeutic Activites    Other Therapeutic Activities explained deep core approaches , relevence of findings to spinal curves and shoulder PSIS pain      Neuro Re-ed    Neuro Re-ed Details  cued for HEP and body mechanics      Manual Therapy   Manual therapy comments STM/MWM at throacic region to promote mobility at T7-10 and decrease paraspinal m tensions, scapular mobility                       PT Long Term Goals - 05/19/20 1053      PT LONG TERM GOAL #1   Title Pt will demo proper pelvic floor coordination to minimize SUI    Time 4    Period Weeks    Status New    Target Date 06/16/20      PT LONG TERM GOAL #2   Title Pt will demo no delayed downward rotation of R shoulder in order to perform  TRX shoulder roll out exercise with less risk for shoulder injuries.    Time 6    Period Weeks    Status New    Target Date 07/28/20      PT LONG TERM GOAL #3   Title Pt will report decreased pain  by 50% for SIJ pain and L knee pain and no pain for L shoulder pain in order to exercise  safely    Baseline SIJ 8/10, L knee pain 3-4/10    Time 10    Period Weeks    Status New    Target Date 07/28/20      PT LONG TERM GOAL #4   Title Pt will demo equal alignment of pelvic girdle and less deviations to spine, levelled shoulder height across 2 visits to progress to deep core exercises    Time 2    Period Weeks    Status New    Target Date 06/02/20                  Plan - 05/19/20 1839    Clinical Impression Statement  Pt is a 35 yo  who presents with SUI and pain with SIJ, L knee, and L shoulder which impacts her QOL and exercise routine, descending stairs.   Pt's musculoskeletal assessment revealed uneven pelvic girdle/ shoulders, spinal deviations,  limited spinal /pelvic mobility, dyscoordination and strength of pelvic floor mm, weak hip weakness, poor body mechanics which places strain on the abdominal/pelvic floor mm.   These are deficits that indicate an ineffective intraabdominal pressure system associated with increased risk for pt's Sx.   Advised pt to not perform sit-ups and crunches as these movement patterns lead to more downward forces on the pelvic floor, negatively impacting abdominopelvic/spinal dysfunctions.   Pt will benefit from coordination training and education on fitness and functional positions in order to gain a more effective intraabdominal pressure system to minimize urinary leakage.  Pt was provided education on etiology of Sx with anatomy, physiology explanation with images along with the benefits of customized pelvic PT Tx based on pt's medical conditions and musculoskeletal deficits.  Explained the physiology of deep core mm coordination and roles of pelvic floor function in urination, defecation, sexual function, and postural control with deep core mm system.   Regional interdependent approaches will yield greater benefits in pt's POC due to the complexity of pt's medical Hx and the significant impact their Sx have had on their QOL.    Following Tx today which pt tolerated without complaints, pt demo'd equal alignment of pelvic girdle and increased spinal mobility. Pt benefits from skilled PT . Plan to teach pt modifications to body mechanics and her fitness routine.     Stability/Clinical Decision Making Evolving/Moderate complexity    Clinical Decision Making Moderate    Rehab Potential Good    PT Frequency 1x / week    PT Duration Other (comment)   10   PT Treatment/Interventions Neuromuscular re-education;Therapeutic activities;Therapeutic exercise;Moist Heat;Functional mobility training;Stair training;Gait training;Patient/family education;Manual techniques;Scar mobilization;Balance training    Consulted and Agree with Plan of Care Patient           Patient will benefit from skilled therapeutic intervention in order to improve the following deficits and impairments:  Decreased strength,Decreased endurance,Decreased activity tolerance,Decreased coordination,Decreased scar mobility,Increased muscle spasms,Hypomobility,Difficulty walking,Decreased knowledge of precautions,Impaired UE functional use,Postural dysfunction,Improper body mechanics,Pain,Hypermobility,Increased fascial restricitons,Decreased mobility,Decreased range of motion,Decreased safety awareness  Visit Diagnosis: Sacrococcygeal disorders, not elsewhere classified  Other lack of coordination  Chronic left shoulder pain  Chronic pain of left knee     Problem List Patient Active Problem List   Diagnosis Date Noted  . Nonallopathic lesion of cervical region 03/31/2019  . Nonallopathic lesion of thoracic region 03/31/2019  . Nonallopathic lesion of rib cage 03/31/2019  .  Annual physical exam 03/20/2018  . Migraine 08/13/2017  . Family history of skin cancer 08/13/2017  . Allergic rhinitis 08/13/2017    Mariane Masters ,PT, DPT, E-RYT  05/19/2020, 6:45 PM  [place] [facility] MAIN [facility] SERVICES [address] [place], [place], [address] Phone: [phone]   Fax:  [phone]  Name: Jenisha Faison MRN: [id number] Date of Birth: 11/30/1985

## 2020-05-19 NOTE — Patient Instructions (Signed)
Sitting posture, not crossing leg 4 points   __   3 x day   Lengthen Back rib by R  shoulder    Lie on L  side , pillow between knees and under head  Pull  arm overhead over mattress, grab the edge of mattress,pull it upward, drawing elbow away from ears  Breathing 10 reps  Open book Lying on  _ side , rotating  __ only this week  Rotating onto pillow /yoga block  Pillow/ Block between knees  10 reps

## 2020-05-26 ENCOUNTER — Other Ambulatory Visit: Payer: Self-pay

## 2020-05-26 ENCOUNTER — Ambulatory Visit: Payer: 59 | Attending: Internal Medicine | Admitting: Physical Therapy

## 2020-05-26 DIAGNOSIS — M25562 Pain in left knee: Secondary | ICD-10-CM | POA: Diagnosis present

## 2020-05-26 DIAGNOSIS — M533 Sacrococcygeal disorders, not elsewhere classified: Secondary | ICD-10-CM | POA: Insufficient documentation

## 2020-05-26 DIAGNOSIS — M25512 Pain in left shoulder: Secondary | ICD-10-CM | POA: Diagnosis present

## 2020-05-26 DIAGNOSIS — R278 Other lack of coordination: Secondary | ICD-10-CM | POA: Diagnosis present

## 2020-05-26 DIAGNOSIS — G8929 Other chronic pain: Secondary | ICD-10-CM | POA: Insufficient documentation

## 2020-05-26 NOTE — Patient Instructions (Signed)
  Neck / shoulder stretches:    Lying on back - small sushi roll towel under neck  _ 6 directions  5 reps  _elbow circles in and out to lower shoulder blades down and back  10 reps  _angel wings, lower elbows down , keep arms touching bed  10 reps    At work:  _Doorway squat : bear stretch for shoulder blades _wall stretch Clock stretch with head turns :  stand perpendicular to the wall, L side to the wall Tilt head to wall  Place L palm at 7 o clock Chin tuck like you are looking into armpit Look at "[place] on giant map  Swivel head with chin tucked to look in upper corner of ceiling as if you are look at  [place] on giant map "   5 reps   Switch direction, R palm on wall at 5 o 'clock   Chin tuck like you are looking into armpit Look at "[place]  on giant map  Swivel head with chin tucked to look in upper corner of ceiling as if you are look at  [facility] on giant map "   5 reps    ___  Deep core 1 and 2 handout

## 2020-05-27 NOTE — Therapy (Signed)
[place] [facility] MAIN [facility] SERVICES [address] [place], [place], [address] Phone: [phone]   Fax:  [phone]  Physical Therapy Treatment  Patient Details  Name: Autumn [name] MRN: [id number] Date of Birth: 12/19/1985 Referring Provider (PT): McLean-Scocuzza, MD   Encounter Date: 05/26/2020   PT End of Session - 05/26/20 1045    Visit Number 2    Number of Visits 10    Date for PT Re-Evaluation 07/28/20    PT Start Time 1007    PT Stop Time 1100    PT Time Calculation (min) 53 min           Past Medical History:  Diagnosis Date  . Allergy   . Arthritis   . GERD (gastroesophageal reflux disease)   . Heart palpitations    years ago thought 2/2 stress did not see cardiology was on amitriptyline per pt   . History of chicken pox   . Hx of migraines     Past Surgical History:  Procedure Laterality Date  . TONSILLECTOMY     2001    There were no vitals filed for this visit.   Subjective Assessment - 05/26/20 1011    Subjective Pt reported she started to avoid sleeping on her back and crossing her legs . Pt noticed her shoulders weere more even with TRX roll out exericse    Pertinent History Dx with arthritis in hips and knees in high school but it does not bother her too mich.    Patient Stated Goals reduce bladder leakage with sneezing and exericising              [facility] PT Assessment - 05/27/20 1010      Palpation   Spinal mobility B iliac crest  , shoulder levelled    Palpation comment tightness L medial scapular mm, scalenes, SCM L                         [facility] Adult PT Treatment/Exercise - 05/27/20 1009      Neuro Re-ed    Neuro Re-ed Details  cued for cervical / scapular retraction, deep core level 1 -2      Manual Therapy   Manual therapy comments STM/MWM at problem areas noted in asessment to promote scapular mobkility, optimize diaphragmatic excursion                       PT  Long Term Goals - 05/19/20 1053      PT LONG TERM GOAL #1   Title Pt will demo proper pelvic floor coordination to minimize SUI    Time 4    Period Weeks    Status New    Target Date 06/16/20      PT LONG TERM GOAL #2   Title Pt will demo no delayed downward rotation of R shoulder in order to perform  TRX shoulder roll out exercise with less risk for shoulder injuries.    Time 6    Period Weeks    Status New    Target Date 07/28/20      PT LONG TERM GOAL #3   Title Pt will report decreased pain by 50% for SIJ pain and L knee pain and no pain for L shoulder pain in order to exercise safely    Baseline SIJ 8/10, L knee pain 3-4/10    Time 10    Period Weeks    Status  New    Target Date 07/28/20      PT LONG TERM GOAL #4   Title Pt will demo equal alignment of pelvic girdle and less deviations to spine, levelled shoulder height across 2 visits to progress to deep core exercises    Time 2    Period Weeks    Status New    Target Date 06/02/20      PT LONG TERM GOAL #5   Title Pt will demo alternative/ modifications to BARRE , HIIT exercises , Crunches and sit ups in order to minimize straining pelvic floor    Time 6    Period Weeks    Status New    Target Date 06/30/20                 Plan - 05/26/20 1045    Clinical Impression Statement Initiated manual Tx to address spinal deviations and hypomobility. Pt demo'd improved diaphragmatic excursion and less chest breathing post Tx which will yield better outcomes with deep core training and pelvic floor control. Pt continues to benefit from skilled PT. Plan to add deep core training next session.    Stability/Clinical Decision Making Evolving/Moderate complexity    Rehab Potential Good    PT Frequency 1x / week    PT Duration Other (comment)   10   PT Treatment/Interventions Neuromuscular re-education;Therapeutic activities;Therapeutic exercise;Moist Heat;Functional mobility training;Stair training;Gait  training;Patient/family education;Manual techniques;Scar mobilization;Balance training    Consulted and Agree with Plan of Care Patient           Patient will benefit from skilled therapeutic intervention in order to improve the following deficits and impairments:  Decreased strength,Decreased endurance,Decreased activity tolerance,Decreased coordination,Decreased scar mobility,Increased muscle spasms,Hypomobility,Difficulty walking,Decreased knowledge of precautions,Impaired UE functional use,Postural dysfunction,Improper body mechanics,Pain,Hypermobility,Increased fascial restricitons,Decreased mobility,Decreased range of motion,Decreased safety awareness  Visit Diagnosis: Sacrococcygeal disorders, not elsewhere classified  Other lack of coordination  Chronic left shoulder pain  Chronic pain of left knee     Problem List Patient Active Problem List   Diagnosis Date Noted  . Nonallopathic lesion of cervical region 03/31/2019  . Nonallopathic lesion of thoracic region 03/31/2019  . Nonallopathic lesion of rib cage 03/31/2019  . Annual physical exam 03/20/2018  . Migraine 08/13/2017  . Family history of skin cancer 08/13/2017  . Allergic rhinitis 08/13/2017    Mariane Masters ,PT, DPT, E-RYT  05/27/2020, 10:11 AM  [place] [facility] MAIN [facility] SERVICES [address] [place], [place], [address] Phone: [phone]   Fax:  [phone]  Name: Autumn [name] MRN: [id number] Date of Birth: Sep 04, 1985

## 2020-06-01 ENCOUNTER — Ambulatory Visit: Payer: 59 | Admitting: Physical Therapy

## 2020-06-14 NOTE — Progress Notes (Signed)
Autumn [name] [address] [place] [address] Phone: [phone] Subjective:   I Autumn [name] am serving as a Neurosurgeon for Dr. Antoine Primas.  This visit occurred during the SARS-CoV-2 public health emergency.  Safety protocols were in place, including screening questions prior to the visit, additional usage of staff PPE, and extensive cleaning of exam room while observing appropriate contact time as indicated for disinfecting solutions.   I'm seeing this patient by the request  of:  [name], Autumn Spillers, MD  CC: Back and neck pain follow-up  XBD:[id number]  Autumn [name] is a 35 y.o. female coming in with complaint of back and neck pain. OMT 04/22/2020.  Patient states she feels normal. Left shoulder and neck is tight.  No radiation down the arm.  Mild headaches intermittently but nothing severe.  Patient did have a infection of some sort that did not cause her not to be quite as active.  Medications patient has been prescribed: Vit D  Taking:         Reviewed prior external information including notes and imaging from previsou exam, outside providers and external EMR if available.   As well as notes that were available from care everywhere and other healthcare systems.  Past medical history, social, surgical and family history all reviewed in electronic medical record.  No pertanent information unless stated regarding to the chief complaint.   Past Medical History:  Diagnosis Date  . Allergy   . Arthritis   . GERD (gastroesophageal reflux disease)   . Heart palpitations    years ago thought 2/2 stress did not see cardiology was on amitriptyline per pt   . History of chicken pox   . Hx of migraines     Allergies  Allergen Reactions  . Other     Dogs, cats, dust mites and mold       Review of Systems:  No  visual changes, nausea, vomiting, diarrhea, constipation, dizziness, abdominal pain, skin rash, fevers, chills, night sweats,  weight loss, swollen lymph nodes, body aches, joint swelling, chest pain, shortness of breath, mood changes. POSITIVE muscle aches, headache  Objective  Blood pressure 106/78, pulse 67, height 5\' 3"  (1.6 m), weight 133 lb (60.3 kg), SpO2 100 %.   General: No apparent distress alert and oriented x3 mood and affect normal, dressed appropriately.  HEENT: Pupils equal, extraocular movements intact  Respiratory: Patient's speak in full sentences and does not appear short of breath  Cardiovascular: No lower extremity edema, non tender, no erythema  Gait normal with good balance and coordination.  MSK:  Non tender with full range of motion and good stability and symmetric strength and tone of shoulders, elbows, wrist, hip, knee and ankles bilaterally.  Back -low back exam does have some mild loss of lordosis.  Patient does have more tightness noted in the parascapular region right greater than left.  Mild tightness of the cervical spine left greater than right though.  Negative Spurling's.  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  Does respond well to osteopathic manipulation.  Patient has done relatively well.  We will continue the vitamin D for now.  Probably can switch to over-the-counter in the near future.  May need to consider  laboratory work-up.  Follow-up with me again 6 to 8 weeks   Nonallopathic problems  Decision today to treat with OMT was based on Physical Exam  After verbal consent patient was treated with HVLA, ME, FPR techniques in cervical, rib, thoracic, lumbar,  areas  Patient tolerated the procedure well with improvement in symptoms  Patient given exercises, stretches and lifestyle modifications  See medications in patient instructions if  given  Patient will follow up in 4-8 weeks      The above documentation has been reviewed and is accurate and complete Judi Saa, DO       Note: This dictation was prepared with Dragon dictation along with smaller phrase technology. Any transcriptional errors that result from this process are unintentional.

## 2020-06-15 ENCOUNTER — Other Ambulatory Visit: Payer: Self-pay

## 2020-06-15 ENCOUNTER — Encounter: Payer: Self-pay | Admitting: Family Medicine

## 2020-06-15 ENCOUNTER — Ambulatory Visit (INDEPENDENT_AMBULATORY_CARE_PROVIDER_SITE_OTHER): Payer: 59 | Admitting: Family Medicine

## 2020-06-15 VITALS — BP 106/78 | HR 67 | Ht 63.0 in | Wt 133.0 lb

## 2020-06-15 DIAGNOSIS — M9908 Segmental and somatic dysfunction of rib cage: Secondary | ICD-10-CM | POA: Diagnosis not present

## 2020-06-15 DIAGNOSIS — M9901 Segmental and somatic dysfunction of cervical region: Secondary | ICD-10-CM

## 2020-06-15 DIAGNOSIS — G43719 Chronic migraine without aura, intractable, without status migrainosus: Secondary | ICD-10-CM | POA: Diagnosis not present

## 2020-06-15 DIAGNOSIS — M9902 Segmental and somatic dysfunction of thoracic region: Secondary | ICD-10-CM | POA: Diagnosis not present

## 2020-06-15 NOTE — Patient Instructions (Addendum)
Good to see you Still some mild tightness not too bad See me again in 6-7 weeks

## 2020-06-15 NOTE — Assessment & Plan Note (Signed)
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  Does respond well to osteopathic manipulation.  Patient has done relatively well.  We will continue the vitamin D for now.  Probably can switch to over-the-counter in the near future.  May need to consider laboratory work-up.  Follow-up with me again 6 to 8 weeks

## 2020-06-16 ENCOUNTER — Ambulatory Visit: Payer: 59 | Admitting: Physical Therapy

## 2020-06-22 ENCOUNTER — Other Ambulatory Visit: Payer: Self-pay

## 2020-06-22 ENCOUNTER — Ambulatory Visit: Payer: 59 | Attending: Internal Medicine | Admitting: Physical Therapy

## 2020-06-22 DIAGNOSIS — M533 Sacrococcygeal disorders, not elsewhere classified: Secondary | ICD-10-CM | POA: Insufficient documentation

## 2020-06-22 DIAGNOSIS — R278 Other lack of coordination: Secondary | ICD-10-CM | POA: Insufficient documentation

## 2020-06-22 DIAGNOSIS — G8929 Other chronic pain: Secondary | ICD-10-CM | POA: Diagnosis present

## 2020-06-22 DIAGNOSIS — M25512 Pain in left shoulder: Secondary | ICD-10-CM | POA: Diagnosis present

## 2020-06-22 DIAGNOSIS — M25562 Pain in left knee: Secondary | ICD-10-CM | POA: Insufficient documentation

## 2020-06-22 NOTE — Patient Instructions (Signed)
   __________________________  Oblique/ scapula stabilization   Opposite arm   Place band in "U"    band under ballmounds  while laying on back w/ knees bent     20 reps  on each side  Holding band from opposite thigh,  Inhale,    exhale then pull band across body while keeping elbow , shoulders, back of the head pressed down    __  Lying on back, knees bent    band under ballmounds  while laying on back w/ knees bent  "W" exercise  10 reps x 2 sets   Band is placed under feet, knees bent, feet are hip width apart Hold band with thumbs point out, keep upper arm and elbow touching the bed the whole time  - inhale and then exhale pull bands by bending elbows hands move in a "w"  (feel shoulder blades squeezing)   __  Stretch for pelvic floor   V- slides  "v heels slide away and then back toward buttocks and then rock knee to slight ,  slide heel along at 11 o clock away from buttocks   10 reps      On belly: Riding horse edge of mattress  knee bent like riding a horse, move knee towards armpit and out  10 reps   Mermaid stretch  Rocking while seated on the floor with heels to one side of the hip Heels to one side of the hip  Rock forward towards the knee that is bent , rock beck towards the opposite sitting bones   Childs pose rocking with wide knees    Rest on bolster in wide knee childs pose

## 2020-06-23 NOTE — Therapy (Signed)
[place] [facility] MAIN [facility] SERVICES [address] [place], [place], [address] Phone: [phone]   Fax:  [phone]  Physical Therapy Treatment  Patient Details  Name: Autumn [name] MRN: [id number] Date of Birth: Oct 14, 1985 Referring Provider (PT): McLean-Scocuzza, MD   Encounter Date: 06/22/2020   PT End of Session - 06/22/20 1006    Visit Number 3    Number of Visits 10    Date for PT Re-Evaluation 07/28/20    PT Start Time 1004    PT Stop Time 1100    PT Time Calculation (min) 56 min           Past Medical History:  Diagnosis Date  . Allergy   . Arthritis   . GERD (gastroesophageal reflux disease)   . Heart palpitations    years ago thought 2/2 stress did not see cardiology was on amitriptyline per pt   . History of chicken pox   . Hx of migraines     Past Surgical History:  Procedure Laterality Date  . TONSILLECTOMY     2001    There were no vitals filed for this visit.   Subjective Assessment - 06/22/20 1006    Subjective Pt reported she notices her breasthing is better and can feel her pelvic floor moving. Pt recovered from sinus infection but noticed more leakage with sneezing. Pt has not slept on her stomach any more    Pertinent History Dx with arthritis in hips and knees in high school but it does not bother her too mich.    Patient Stated Goals reduce bladder leakage with sneezing and exericising              [facility] PT Assessment - 06/23/20 0916      Coordination   Coordination and Movement Description poor scapular retraction and depression      Palpation   Spinal mobility scapular downward movement equal , shoulder and pelvic height levelled                      Pelvic Floor Special Questions - 06/23/20 0932    External Perineal Exam without clothing: L ischiocavernosus / perineal transverse ,             [facility] Adult PT Treatment/Exercise - 06/23/20 0911      Neuro Re-ed    Neuro Re-ed  Details  cued for new HEP for scapulothoracic and cues for feet  stabilization      Manual Therapy   Manual therapy comments external: STM/MWM pelvic floor anterior mm 1-2 layers B                       PT Long Term Goals - 05/19/20 1053      PT LONG TERM GOAL #1   Title Pt will demo proper pelvic floor coordination to minimize SUI    Time 4    Period Weeks    Status New    Target Date 06/16/20      PT LONG TERM GOAL #2   Title Pt will demo no delayed downward rotation of R shoulder in order to perform  TRX shoulder roll out exercise with less risk for shoulder injuries.    Time 6    Period Weeks    Status New    Target Date 07/28/20      PT LONG TERM GOAL #3   Title Pt will report decreased pain by 50% for SIJ  pain and L knee pain and no pain for L shoulder pain in order to exercise safely    Baseline SIJ 8/10, L knee pain 3-4/10    Time 10    Period Weeks    Status New    Target Date 07/28/20      PT LONG TERM GOAL #4   Title Pt will demo equal alignment of pelvic girdle and less deviations to spine, levelled shoulder height across 2 visits to progress to deep core exercises    Time 2    Period Weeks    Status New    Target Date 06/02/20      PT LONG TERM GOAL #5   Title Pt will demo alternative/ modifications to BARRE , HIIT exercises , Crunches and sit ups in order to minimize straining pelvic floor    Time 6    Period Weeks    Status New    Target Date 06/30/20                 Plan - 06/22/20 1006    Clinical Impression Statement Pt demo'd equal alignment of shoulders and pelvis which is good carry over from past Tx. Advanced pt to scapulothoracic stabilization which pt required cues for proper technique to minimize shoulder IR.    Assessed pelvic floor mm externally and pt showed increased more tightness on L side anterior mm.  Pt demo'd increased mobility post Tx. Pt continues to benefit from skilled PT    Stability/Clinical Decision  Making Evolving/Moderate complexity    Rehab Potential Good    PT Frequency 1x / week    PT Duration Other (comment)   10   PT Treatment/Interventions Neuromuscular re-education;Therapeutic activities;Therapeutic exercise;Moist Heat;Functional mobility training;Stair training;Gait training;Patient/family education;Manual techniques;Scar mobilization;Balance training    Consulted and Agree with Plan of Care Patient           Patient will benefit from skilled therapeutic intervention in order to improve the following deficits and impairments:  Decreased strength,Decreased endurance,Decreased activity tolerance,Decreased coordination,Decreased scar mobility,Increased muscle spasms,Hypomobility,Difficulty walking,Decreased knowledge of precautions,Impaired UE functional use,Postural dysfunction,Improper body mechanics,Pain,Hypermobility,Increased fascial restricitons,Decreased mobility,Decreased range of motion,Decreased safety awareness  Visit Diagnosis: Sacrococcygeal disorders, not elsewhere classified  Chronic left shoulder pain  Other lack of coordination  Chronic pain of left knee     Problem List Patient Active Problem List   Diagnosis Date Noted  . Nonallopathic lesion of cervical region 03/31/2019  . Nonallopathic lesion of thoracic region 03/31/2019  . Nonallopathic lesion of rib cage 03/31/2019  . Annual physical exam 03/20/2018  . Migraine 08/13/2017  . Family history of skin cancer 08/13/2017  . Allergic rhinitis 08/13/2017    Mariane Masters ,PT, DPT, E-RYT  06/23/2020, 9:33 AM  [place] [facility] MAIN [facility] SERVICES [address] [place], [place], [address] Phone: [phone]   Fax:  [phone]  Name: Autumn [name] MRN: [id number] Date of Birth: 05-14-1985

## 2020-06-30 ENCOUNTER — Ambulatory Visit: Payer: 59 | Admitting: Physical Therapy

## 2020-07-07 ENCOUNTER — Ambulatory Visit: Payer: 59 | Admitting: Physical Therapy

## 2020-07-07 ENCOUNTER — Other Ambulatory Visit: Payer: Self-pay

## 2020-07-07 DIAGNOSIS — G8929 Other chronic pain: Secondary | ICD-10-CM

## 2020-07-07 DIAGNOSIS — M533 Sacrococcygeal disorders, not elsewhere classified: Secondary | ICD-10-CM | POA: Diagnosis not present

## 2020-07-07 DIAGNOSIS — R278 Other lack of coordination: Secondary | ICD-10-CM

## 2020-07-07 DIAGNOSIS — M25512 Pain in left shoulder: Secondary | ICD-10-CM

## 2020-07-07 NOTE — Therapy (Addendum)
[place] MAIN [facility] SERVICES [address] [place], [place], [address] Phone: [phone]   Fax:  [phone]  Physical Therapy Treatment  Patient Details  Name: Autumn [name] MRN: [id number] Date of Birth: 03/16/1985 Referring Provider (PT): McLean-Scocuzza, MD   Encounter Date: 07/07/2020   PT End of Session - 07/07/20 1059     Visit Number 4    Number of Visits 10    Date for PT Re-Evaluation 07/28/20    PT Start Time 1004    PT Stop Time 1100    PT Time Calculation (min) 56 min             Past Medical History:  Diagnosis Date   Allergy    Arthritis    GERD (gastroesophageal reflux disease)    Heart palpitations    years ago thought 2/2 stress did not see cardiology was on amitriptyline per pt    History of chicken pox    Hx of migraines     Past Surgical History:  Procedure Laterality Date   TONSILLECTOMY     2001    There were no vitals filed for this visit.   Subjective Assessment - 07/07/20 1005     Subjective Pt reported she noticed no leakage when sneezing yesterday    Pertinent History Dx with arthritis in hips and knees in high school but it does not bother her too mich.    Patient Stated Goals reduce bladder leakage with sneezing and exericising                [facility] PT Assessment - 07/07/20 1101       Observation/Other Assessments   Observations simulated barre/ pilate exercsies: posterior tilt in all 3, lumbar lodosis, medial collpase of feet                        Pelvic Floor Special Questions - 07/07/20 1044     External Perineal Exam pt consented vebrbally without contradictions    External Palpation tightness coccygeus, scar restrictions on L               [facility] Adult PT Treatment/Exercise - 07/07/20 1049       Therapeutic Activites    Other Therapeutic Activities modified barre/pilate standing exercises      Neuro Re-ed    Neuro Re-ed Details  cued for  anterior tilt and posterior tilt and feet co-activation to minimize SIJ pain      Manual Therapy   Manual therapy comments internally: 5-7 oclock, externally : ischial rami B to release scar restrictions, coccygeus                         PT Long Term Goals - 05/19/20 1053       PT LONG TERM GOAL #1   Title Pt will demo proper pelvic floor coordination to minimize SUI    Time 4    Period Weeks    Status New    Target Date 06/16/20      PT LONG TERM GOAL #2   Title Pt will demo no delayed downward rotation of R shoulder in order to perform  TRX shoulder roll out exercise with less risk for shoulder injuries.    Time 6    Period Weeks    Status New    Target Date 07/28/20      PT LONG TERM GOAL #3   Title  Pt will report decreased pain by 50% for SIJ pain and L knee pain and no pain for L shoulder pain in order to exercise safely    Baseline SIJ 8/10, L knee pain 3-4/10    Time 10    Period Weeks    Status New    Target Date 07/28/20      PT LONG TERM GOAL #4   Title Pt will demo equal alignment of pelvic girdle and less deviations to spine, levelled shoulder height across 2 visits to progress to deep core exercises    Time 2    Period Weeks    Status New    Target Date 06/02/20      PT LONG TERM GOAL #5   Title Pt will demo alternative/ modifications to BARRE , HIIT exercises , Crunches and sit ups in order to minimize straining pelvic floor    Time 6    Period Weeks    Status New    Target Date 06/30/20                   Plan - 07/07/20 1100     Clinical Impression Statement Pt is making progress with report of noticing no leakage with sneezing as she remains compliant with HEP.  Pt demo'd increased posterior pelvic floor mm tightness with perineal scar restrictions today. Following internal and external manual Tx, pt demo'd improved propioception of anterior tilt of pelvis and lengthening and upward lift of posterior mm. Pt required excessive  cues for anterior tilt of pelvis as well in simulated BArre/ Pilates exercises in addition to lower kinetic chain alignment and co-activation to minimze SIJ pain, optimize feet arches, and minimize lumbar lordosis. Pt continues to benefit from skilled PT.     Stability/Clinical Decision Making Evolving/Moderate complexity    Rehab Potential Good    PT Frequency 1x / week    PT Duration Other (comment) -  10   PT Treatment/Interventions Neuromuscular re-education;Therapeutic activities;Therapeutic exercise;Moist Heat;Functional mobility training;Stair training;Gait training;Patient/family education;Manual techniques;Scar mobilization;Balance training    Consulted and Agree with Plan of Care Patient             Patient will benefit from skilled therapeutic intervention in order to improve the following deficits and impairments:  Decreased strength,Decreased endurance,Decreased activity tolerance,Decreased coordination,Decreased scar mobility,Increased muscle spasms,Hypomobility,Difficulty walking,Decreased knowledge of precautions,Impaired UE functional use,Postural dysfunction,Improper body mechanics,Pain,Hypermobility,Increased fascial restricitons,Decreased mobility,Decreased range of motion,Decreased safety awareness  Visit Diagnosis: Sacrococcygeal disorders, not elsewhere classified  Chronic left shoulder pain  Other lack of coordination  Chronic pain of left knee     Problem List Patient Active Problem List   Diagnosis Date Noted   Nonallopathic lesion of cervical region 03/31/2019   Nonallopathic lesion of thoracic region 03/31/2019   Nonallopathic lesion of rib cage 03/31/2019   Annual physical exam 03/20/2018   Migraine 08/13/2017   Family history of skin cancer 08/13/2017   Allergic rhinitis 08/13/2017    Mariane Masters ,PT, DPT, E-RYT  07/07/2020, 11:03 AM  [place] [facility] MAIN [facility] SERVICES [address] [place], [place],  [address] Phone: [phone]   Fax:  [phone]  Name: Areej Tayler MRN: [id number] Date of Birth: 1985-11-24

## 2020-07-07 NOTE — Patient Instructions (Signed)
Continue with pelvic floor stretches   Find anterior tilt with deep core level 1 -2    Pilates/ Barre modifications  -: hinge at hips and less posterior tilt , more feet activation, no toe gripping-  -less wide pleat and less turned out, more pinky toe pressure-lift arch of foot -heel raise  With a hinge at hips  -leg lifts at the barre with less back arch, toes point down

## 2020-07-14 ENCOUNTER — Ambulatory Visit: Payer: 59 | Admitting: Physical Therapy

## 2020-07-21 ENCOUNTER — Ambulatory Visit: Payer: 59 | Admitting: Physical Therapy

## 2020-07-28 ENCOUNTER — Other Ambulatory Visit: Payer: Self-pay

## 2020-07-28 ENCOUNTER — Ambulatory Visit: Payer: 59 | Attending: Internal Medicine | Admitting: Physical Therapy

## 2020-07-28 DIAGNOSIS — G8929 Other chronic pain: Secondary | ICD-10-CM | POA: Diagnosis present

## 2020-07-28 DIAGNOSIS — M25562 Pain in left knee: Secondary | ICD-10-CM | POA: Insufficient documentation

## 2020-07-28 DIAGNOSIS — R278 Other lack of coordination: Secondary | ICD-10-CM | POA: Diagnosis present

## 2020-07-28 DIAGNOSIS — M25512 Pain in left shoulder: Secondary | ICD-10-CM | POA: Insufficient documentation

## 2020-07-28 DIAGNOSIS — M533 Sacrococcygeal disorders, not elsewhere classified: Secondary | ICD-10-CM | POA: Diagnosis not present

## 2020-07-28 NOTE — Patient Instructions (Signed)
Feet windshield wipers  to relax pelvic floor

## 2020-07-28 NOTE — Therapy (Signed)
[facility] MAIN [facility] SERVICES [address] [place], [place], [address] Phone: [phone]   Fax:  [phone]  Physical Therapy Treatment / Discharge Summary   Patient Details  Name: Autumn [name] MRN: [id number] Date of Birth: [birth year] Referring Provider (PT): McLean-Scocuzza, MD   Encounter Date: 07/28/2020   PT End of Session - 07/28/20 1007     Visit Number 5    Number of Visits 10    Date for PT Re-Evaluation 07/28/20    PT Start Time 1004    PT Stop Time 1045    PT Time Calculation (min) 41 min    Activity Tolerance Patient tolerated treatment well             Past Medical History:  Diagnosis Date   Allergy    Arthritis    GERD (gastroesophageal reflux disease)    Heart palpitations    years ago thought 2/2 stress did not see cardiology was on amitriptyline per pt    History of chicken pox    Hx of migraines     Past Surgical History:  Procedure Laterality Date   TONSILLECTOMY     2001    There were no vitals filed for this visit.   Subjective Assessment - 07/28/20 1008     Subjective Pt reports she was able to do jumping jacks and returned to running and she did not notice anything dropped out. Pt had not been able to do jumping jacks for 2 years.    Pertinent History Dx with arthritis in hips and knees in high school but it does not bother her too mich.    Patient Stated Goals reduce bladder leakage with sneezing and exericising                            Pelvic Floor Special Questions - 07/28/20 1037     Palpation R 7 o'clock scar restrictions               [facility] Adult PT Treatment/Exercise - 07/28/20 1036       Therapeutic Activites    Other Therapeutic Activities reassessed goals , discussed d/c      Neuro Re-ed    Neuro Re-ed Details  cued for upward lift of pelvic floor after complete lengthening,      Moist Heat Therapy   Number Minutes Moist Heat 5 Minutes    Moist  Heat Location --   perineum ( through sheets)     Manual Therapy   Manual therapy comments internally: R 7 o'clock scar restrictions                         PT Long Term Goals - 07/28/20 1010       PT LONG TERM GOAL #1   Title Pt will demo proper pelvic floor coordination to minimize SUI    Time 4    Period Weeks    Status Achieved      PT LONG TERM GOAL #2   Title Pt will demo no delayed downward rotation of R shoulder in order to perform  TRX shoulder roll out exercise with less risk for shoulder injuries.    Time 6    Period Weeks    Status Achieved      PT LONG TERM GOAL #3   Title Pt will report decreased pain by 50% for SIJ pain and L knee  pain and no pain for L shoulder pain in order to exercise safely    Baseline SIJ 8/10, L knee pain 3-4/10   ( 07/28/20: 0%)    Time 10    Period Weeks    Status Achieved      PT LONG TERM GOAL #4   Title Pt will demo equal alignment of pelvic girdle and less deviations to spine, levelled shoulder height across 2 visits to progress to deep core exercises    Time 2    Period Weeks    Status Achieved      PT LONG TERM GOAL #5   Title Pt will demo alternative/ modifications to BARRE , HIIT exercises , Crunches and sit ups in order to minimize straining pelvic floor    Time 6    Period Weeks    Status Achieved                   Plan - 07/28/20 1008     Clinical Impression Statement Pt has achieved 100% of her goals. Pt's SIJ pain, urinary Sx, L shoulder and knee pain have resolved. Pt has been able to do jumping jacks and run in her exercise routine which are activities she has not been able to perform in the past without issues. Pt is able to perform TRX exercises without shoulder pain. Pt demo'd improved lower kinetic chain alignment and propiocpetion which has helped with L knee pain.  Pt showed equal alignment of pelvic girdle/ spine, decreased perineal scar restrictions and pelvic floor mm tightness, improved  deep core coordination and lengthening of pelvic floor mm. Pt demo'd IND with pilates and BARRE and HIIT modifications to minimize overactivity of pelvic floor mm. Pt's FOTO scores indicate signficant changes as well (pelvic pain from 8 pts to 0pts, PFDI urinary from 4pts to 0pts.)  Pt is ready for d/c at this time.       Stability/Clinical Decision Making Evolving/Moderate complexity    Rehab Potential Good    PT Frequency 1x / week    PT Duration Other (comment)   10   PT Treatment/Interventions Neuromuscular re-education;Therapeutic activities;Therapeutic exercise;Moist Heat;Functional mobility training;Stair training;Gait training;Patient/family education;Manual techniques;Scar mobilization;Balance training    Consulted and Agree with Plan of Care Patient             Patient will benefit from skilled therapeutic intervention in order to improve the following deficits and impairments:  Decreased strength, Decreased endurance, Decreased activity tolerance, Decreased coordination, Decreased scar mobility, Increased muscle spasms, Hypomobility, Difficulty walking, Decreased knowledge of precautions, Impaired UE functional use, Postural dysfunction, Improper body mechanics, Pain, Hypermobility, Increased fascial restricitons, Decreased mobility, Decreased range of motion, Decreased safety awareness  Visit Diagnosis: No diagnosis found.     Problem List Patient Active Problem List   Diagnosis Date Noted   Nonallopathic lesion of cervical region 03/31/2019   Nonallopathic lesion of thoracic region 03/31/2019   Nonallopathic lesion of rib cage 03/31/2019   Annual physical exam 03/20/2018   Migraine 08/13/2017   Family history of skin cancer 08/13/2017   Allergic rhinitis 08/13/2017    Mariane Masters ,PT, DPT, E-RYT  07/28/2020, 10:43 AM  [place] MAIN [facility] SERVICES [address] [place], [place], [address] Phone: [phone]   Fax:   [phone]  Name: Autumn [name] MRN: [id number] Date of Birth: [birth year]

## 2020-08-02 NOTE — Progress Notes (Deleted)
[name] [address] [place] [address] Phone: [phone] Subjective:    I'm seeing this patient by the request  of:  McLean-Scocuzza, [name], MD  CC: Neck pain and upper back pain follow-up  [id number]  Autumn [name] is a 35 y.o. female coming in with complaint of back and neck pain. OMT 06/15/2020. Patient states   Medications patient has been prescribed: Vit D  Taking:         Reviewed prior external information including notes and imaging from previsou exam, outside providers and external EMR if available.  Reviewing patient's notes has been seen recently for lower back pain and doing rehabilitation.  This is an outside provider.  As well as notes that were available from care everywhere and other healthcare systems.  Past medical history, social, surgical and family history all reviewed in electronic medical record.  No pertanent information unless stated regarding to the chief complaint.   Past Medical History:  Diagnosis Date   Allergy    Arthritis    GERD (gastroesophageal reflux disease)    Heart palpitations    years ago thought 2/2 stress did not see cardiology was on amitriptyline per pt    History of chicken pox    Hx of migraines     Allergies  Allergen Reactions   Other     Dogs, cats, dust mites and mold       Review of Systems:  No headache, visual changes, nausea, vomiting, diarrhea, constipation, dizziness, abdominal pain, skin rash, fevers, chills, night sweats, weight loss, swollen lymph nodes, body aches, joint swelling, chest pain, shortness of breath, mood changes. POSITIVE muscle aches  Objective  There were no vitals taken for this visit.   General: No apparent distress alert and oriented x3 mood and affect normal, dressed appropriately.  HEENT: Pupils equal, extraocular movements intact  Respiratory: Patient's speak in full sentences and does not appear short of breath  Cardiovascular: No  lower extremity edema, non tender, no erythema  Neuro: Cranial nerves II through XII are intact, neurovascularly intact in all extremities with 2+ DTRs and 2+ pulses.  Gait normal with good balance and coordination.  MSK:  Non tender with full range of motion and good stability and symmetric strength and tone of shoulders, elbows, wrist, hip, knee and ankles bilaterally.  Back - Normal skin, Spine with normal alignment and no deformity.  No tenderness to vertebral process palpation.  Paraspinous muscles are not tender and without spasm.   Range of motion is full at neck and lumbar sacral regions  Osteopathic findings  C6 flexed rotated and side bent left T3 extended rotated and side bent right inhaled rib T9 extended rotated and side bent left L2 flexed rotated and side bent right Sacrum left on left       Assessment and Plan:  No problem-specific Assessment & Plan notes found for this encounter.   Nonallopathic problems  Decision today to treat with OMT was based on Physical Exam  After verbal consent patient was treated with HVLA, ME, FPR techniques in cervical, rib, thoracic, lumbar, and sacral  areas  Patient tolerated the procedure well with improvement in symptoms  Patient given exercises, stretches and lifestyle modifications  See medications in patient instructions if given  Patient will follow up in 4-8 weeks      The above documentation has been reviewed and is accurate and complete Judi Saa, DO       Note: This  dictation was prepared with Dragon dictation along with smaller phrase technology. Any transcriptional errors that result from this process are unintentional.

## 2020-08-03 ENCOUNTER — Ambulatory Visit: Payer: 59 | Admitting: Family Medicine

## 2020-08-17 IMAGING — DX DG CERVICAL SPINE COMPLETE 4+V
5 series · 5 of 5 positions shown · non-contrast
Comparison: None.

CLINICAL DATA: Neck pain.  No known injury.

EXAM:
CERVICAL SPINE - COMPLETE 4+ VIEW

[cervical spine lat]
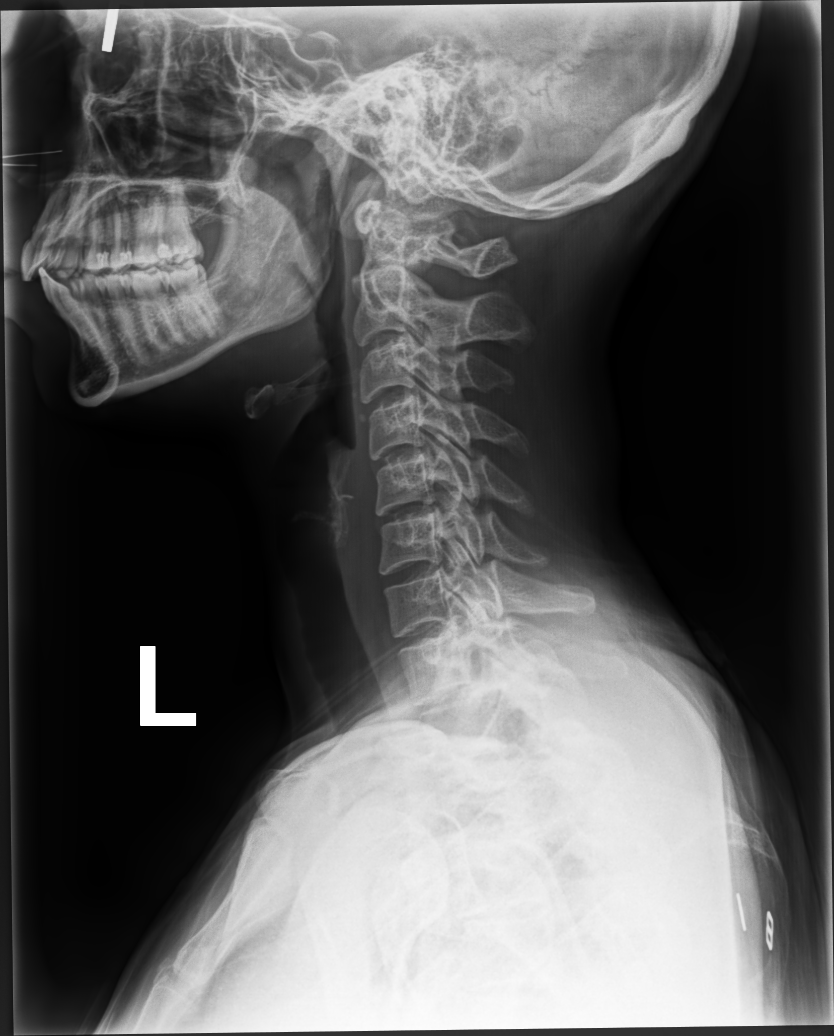

[cervical spine mlo (1 of 2)]
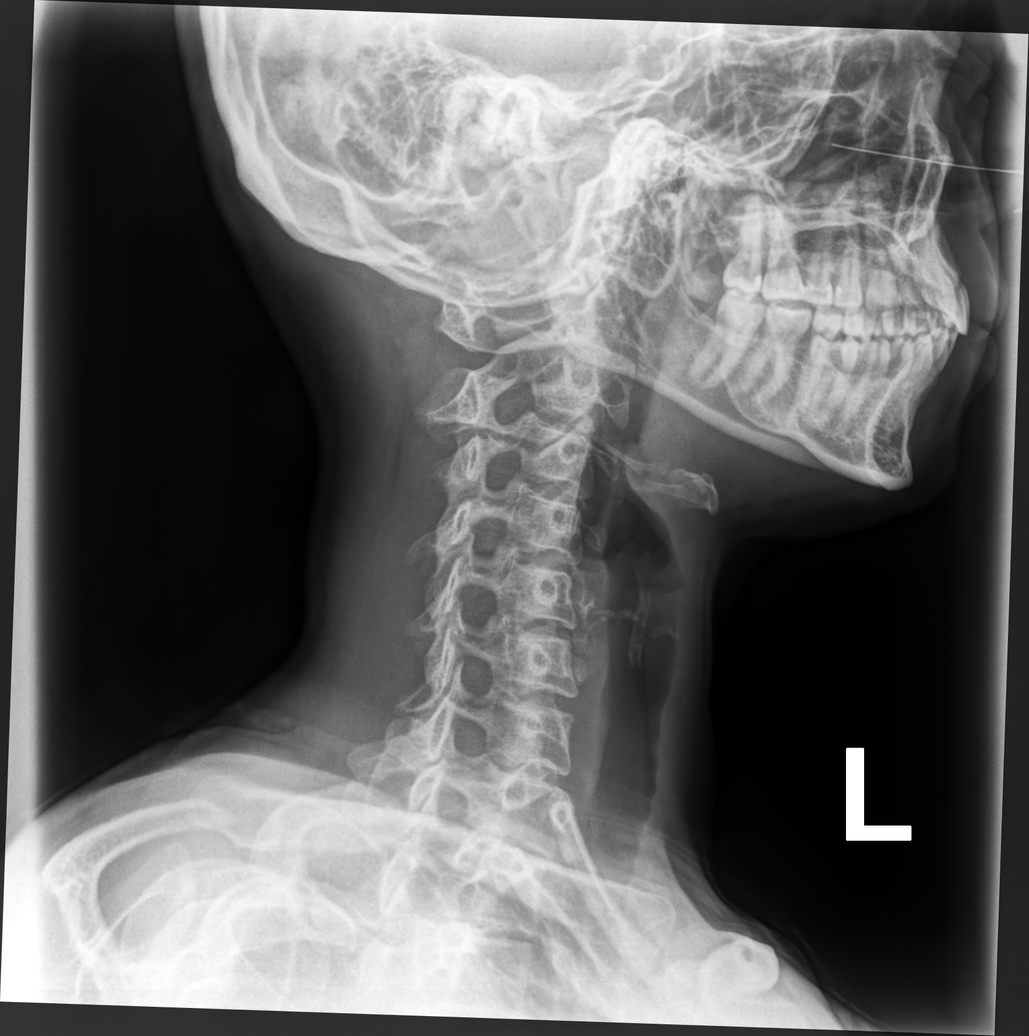

[cervical spine mlo (2 of 2)]
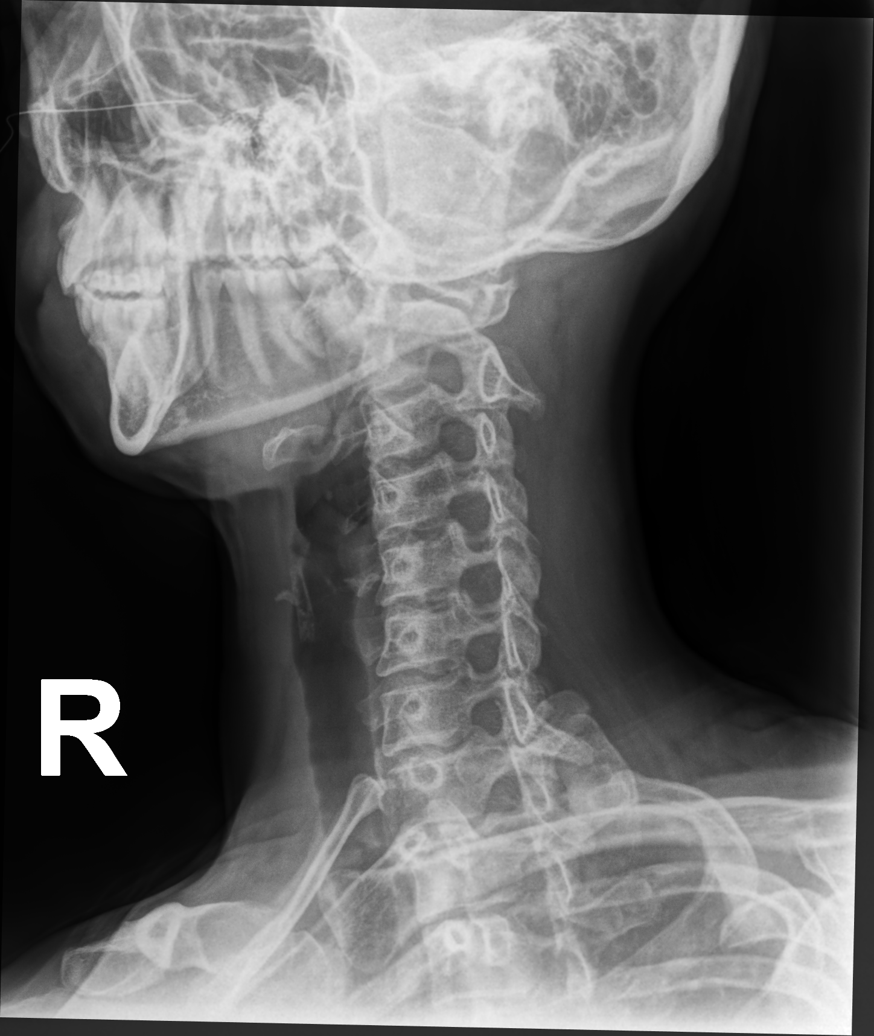

[cervical spine ap]
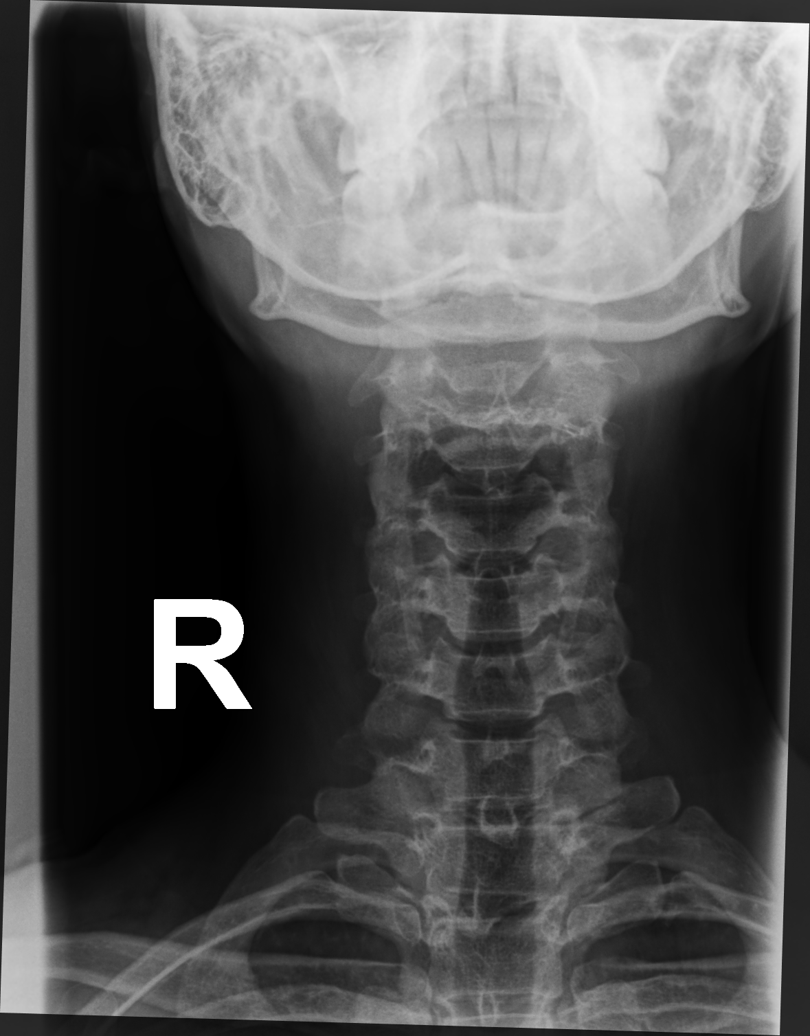

[cervical spine open mouth ap]
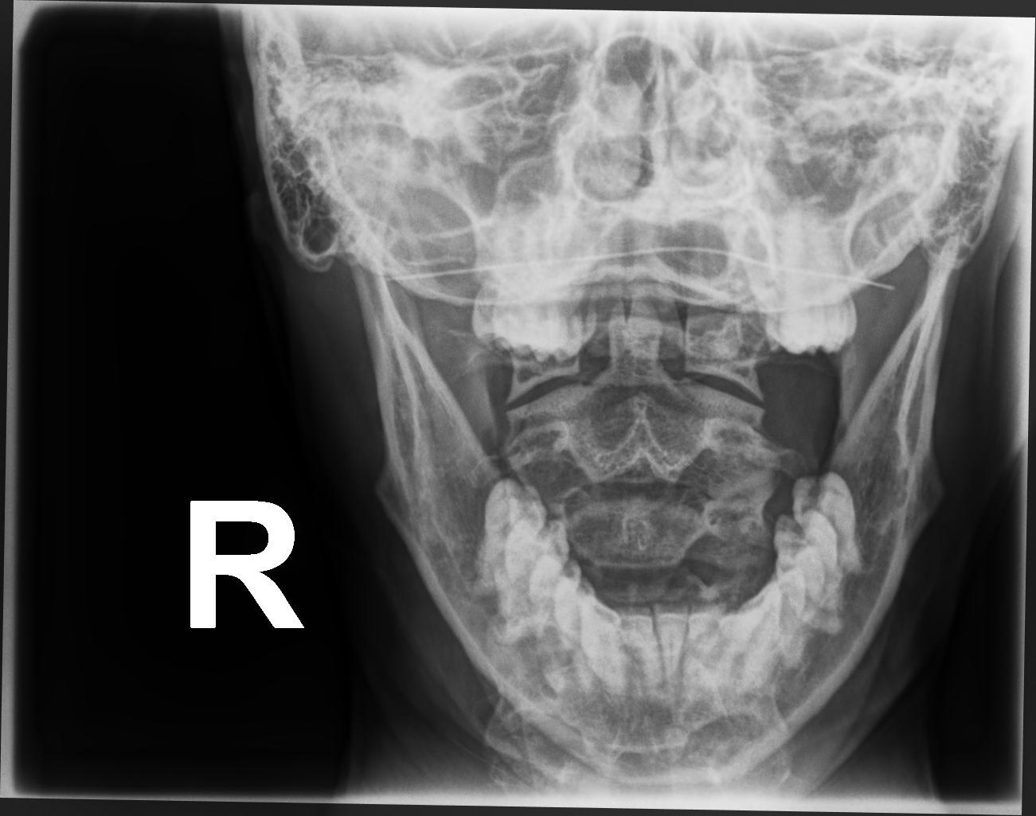

[5 of 5 positions shown; findings below may reference images not displayed]

FINDINGS: There is no evidence of cervical spine fracture or prevertebral soft
tissue swelling. Alignment is normal. No other significant bone
abnormalities are identified.
IMPRESSION: Negative cervical spine radiographs.

## 2020-09-29 NOTE — Progress Notes (Signed)
[name] [address] [place] [address] Phone: [phone] Subjective:   Bruce Donath, am serving as a scribe for Dr. Antoine Primas. This visit occurred during the SARS-CoV-2 public health emergency.  Safety protocols were in place, including screening questions prior to the visit, additional usage of staff PPE, and extensive cleaning of exam room while observing appropriate contact time as indicated for disinfecting solutions.   I'm seeing this patient by the request  of:  McLean-Scocuzza, [name], MD  CC: Neck pain follow-up  [id number]  Autumn [name] is a 35 y.o. female coming in with complaint of back and neck pain. OMT 06/15/2020. Patient has been doing PT but is finished now. Patient states that she had increase in pain since mid-June. Pain started in R ear and TMJ. Patient has had botox treatment in cervical spine and neck became even more sore. B SCM are very tight and sore once botox wore off. Pain with rotation.   Medications patient has been prescribed: None  Taking:         Reviewed prior external information including notes and imaging from previsou exam, outside providers and external EMR if available.   As well as notes that were available from care everywhere and other healthcare systems.  Past medical history, social, surgical and family history all reviewed in electronic medical record.  No pertanent information unless stated regarding to the chief complaint.   Past Medical History:  Diagnosis Date   Allergy    Arthritis    GERD (gastroesophageal reflux disease)    Heart palpitations    years ago thought 2/2 stress did not see cardiology was on amitriptyline per pt    History of chicken pox    Hx of migraines     Allergies  Allergen Reactions   Other     Dogs, cats, dust mites and mold       Review of Systems:  No headache, visual changes, nausea, vomiting, diarrhea, constipation, dizziness, abdominal  pain, skin rash, fevers, chills, night sweats, weight loss, swollen lymph nodes, body aches, joint swelling, chest pain, shortness of breath, mood changes. POSITIVE muscle aches  Objective  Blood pressure 114/82, pulse 69, height 5\' 3"  (1.6 m), weight 137 lb (62.1 kg), SpO2 99 %.   General: No apparent distress alert and oriented x3 mood and affect normal, dressed appropriately.  HEENT: Pupils equal, extraocular movements intact  Respiratory: Patient's speak in full sentences and does not appear short of breath  Cardiovascular: No lower extremity edema, non tender, no erythema  Neuro: Cranial nerves II through XII are intact, neurovascularly intact in all extremities with 2+ DTRs and 2+ pulses.  Gait normal with good balance and coordination.  MSK:  Non tender with full range of motion and good stability and symmetric strength and tone of shoulders, elbows, wrist, hip, knee and ankles bilaterally.  Significant increase in tightness of the neck bilaterally.  Patient has more of a spasm even of the trapezius bilaterally.  Significant tightness of the sternocleidomastoid bilaterally.  Negative Spurling's.  Tightness in the parascapular region as well.  Osteopathic findings  C2 flexed rotated and side bent right C6 flexed rotated and side bent left T3 extended rotated and side bent right inhaled rib T9 extended rotated and side bent left        Assessment and Plan:  Cervical paraspinal muscle spasm More tightness of the neck noted.  Discussed with patient about icing regimen and home exercises.  Discussed ergonomics.  Patient has had some increasing stress recently that likely contributed.  Follow-up with me again 4 to 6 weeks   Nonallopathic problems  Decision today to treat with OMT was based on Physical Exam  After verbal consent patient was treated with HVLA, ME, FPR techniques in cervical, rib, thoracic areas  Patient tolerated the procedure well with improvement in  symptoms  Patient given exercises, stretches and lifestyle modifications  See medications in patient instructions if given  Patient will follow up in 4-6 weeks      The above documentation has been reviewed and is accurate and complete Judi Saa, DO       Note: This dictation was prepared with Dragon dictation along with smaller phrase technology. Any transcriptional errors that result from this process are unintentional.

## 2020-10-03 ENCOUNTER — Other Ambulatory Visit: Payer: Self-pay

## 2020-10-03 ENCOUNTER — Ambulatory Visit (INDEPENDENT_AMBULATORY_CARE_PROVIDER_SITE_OTHER): Payer: 59 | Admitting: Family Medicine

## 2020-10-03 VITALS — BP 114/82 | HR 69 | Ht 63.0 in | Wt 137.0 lb

## 2020-10-03 DIAGNOSIS — M9902 Segmental and somatic dysfunction of thoracic region: Secondary | ICD-10-CM | POA: Diagnosis not present

## 2020-10-03 DIAGNOSIS — M9908 Segmental and somatic dysfunction of rib cage: Secondary | ICD-10-CM | POA: Diagnosis not present

## 2020-10-03 DIAGNOSIS — M62838 Other muscle spasm: Secondary | ICD-10-CM

## 2020-10-03 DIAGNOSIS — M9901 Segmental and somatic dysfunction of cervical region: Secondary | ICD-10-CM

## 2020-10-03 NOTE — Assessment & Plan Note (Signed)
More tightness of the neck noted.  Discussed with patient about icing regimen and home exercises.  Discussed ergonomics.  Patient has had some increasing stress recently that likely contributed.  Follow-up with me again 4 to 6 weeks

## 2020-10-03 NOTE — Patient Instructions (Signed)
Good to see you Glad you can turn your neck See me again in 5-6 weeks

## 2020-11-08 NOTE — Progress Notes (Signed)
[name] [address] [place] [address] Phone: [phone] Subjective:   Bruce Donath, am serving as a scribe for Dr. Antoine Primas. This visit occurred during the SARS-CoV-2 public health emergency.  Safety protocols were in place, including screening questions prior to the visit, additional usage of staff PPE, and extensive cleaning of exam room while observing appropriate contact time as indicated for disinfecting solutions.   I'm seeing this patient by the request  of:  McLean-Scocuzza, [name], MD  CC: Neck and neck pain follow-up, foot pain  [id number]  Autumn [name] is a 35 y.o. female coming in with complaint of back and neck pain. OMT 10/03/2020. Patient states that she has been doing well since visit. Has been doing stretching more often. Mild discomfort from time to time but nothing severe.  Not having as much neck tightness as previous exam no radiation down the arms. Also having intermittent pain in R foot, 2nd metatarsal for the past year with worsening symptoms in past 2 weeks. Patient notices pain with walking and toe flexion.   Medications patient has been prescribed: None  Taking:           Past Medical History:  Diagnosis Date   Allergy    Arthritis    GERD (gastroesophageal reflux disease)    Heart palpitations    years ago thought 2/2 stress did not see cardiology was on amitriptyline per pt    History of chicken pox    Hx of migraines     Allergies  Allergen Reactions   Other     Dogs, cats, dust mites and mold       Review of Systems:  No headache, visual changes, nausea, vomiting, diarrhea, constipation, dizziness, abdominal pain, skin rash, fevers, chills, night sweats, weight loss, swollen lymph nodes, body aches, joint swelling, chest pain, shortness of breath, mood changes. POSITIVE muscle aches  Objective  Blood pressure 130/82, pulse 97, height 5\' 3"  (1.6 m), weight 136 lb (61.7 kg), SpO2  97 %.   General: No apparent distress alert and oriented x3 mood and affect normal, dressed appropriately.  HEENT: Pupils equal, extraocular movements intact  Respiratory: Patient's speak in full sentences and does not appear short of breath  Cardiovascular: No lower extremity edema, non tender, no erythema  Neck exam does have some mild loss of lordosis.  Some tenderness to palpation still in the parascapular region sidebending of the neck bilaterally. Foot exam  m shows the patient does have a more consistent with the right foot.  Patient is some mild limited flexion and extension  Osteopathic findings  C6 flexed rotated and side bent left T3 extended rotated and side bent left inhaled rib    Limited muscular skeletal ultrasound was performed and interpreted by , M  Limited musculoskeletal ultrasound of the right foot shows that patient second metatarsal MTP joint does have hypoechoic changes noted.  Consistent with a potential synovitis.  Seems to be chronic in nature.  No cortical irregularity noted. Impression: Synovitis of the second toe    Assessment and Plan:  Metatarsalgia Patient has a metatarsalgia that seems to be more secondary to the second toe and does have very mild synovitis noted.  Seems to be chronic.  Discussed recovery sandals, over-the-counter orthotics, icing regimen and topical anti-inflammatories.  Worsening symptoms consider injection.  Follow-up with me again in 6 to 8 weeks  Cervical paraspinal muscle spasm Chronic, stable.  Still responding well to osteopathic  manipulation.  Discussed posture ergonomics.  Discussed which activities to doing things to avoid.  Increase activity slowly.  Follow-up with me again in 6 to 8 weeks.   Nonallopathic problems  Decision today to treat with OMT was based on Physical Exam  After verbal consent patient was treated with HVLA, ME, FPR techniques in cervical, rib, thoracic,  areas  Patient tolerated the  procedure well with improvement in symptoms  Patient given exercises, stretches and lifestyle modifications  See medications in patient instructions if given  Patient will follow up in 4-8 weeks      The above documentation has been reviewed and is accurate and complete Judi Saa, DO       Note: This dictation was prepared with Dragon dictation along with smaller phrase technology. Any transcriptional errors that result from this process are unintentional.

## 2020-11-09 ENCOUNTER — Other Ambulatory Visit: Payer: Self-pay

## 2020-11-09 ENCOUNTER — Encounter: Payer: Self-pay | Admitting: Family Medicine

## 2020-11-09 ENCOUNTER — Ambulatory Visit: Payer: Self-pay

## 2020-11-09 ENCOUNTER — Ambulatory Visit (INDEPENDENT_AMBULATORY_CARE_PROVIDER_SITE_OTHER): Payer: 59 | Admitting: Family Medicine

## 2020-11-09 VITALS — BP 130/82 | HR 97 | Ht 63.0 in | Wt 136.0 lb

## 2020-11-09 DIAGNOSIS — M62838 Other muscle spasm: Secondary | ICD-10-CM | POA: Diagnosis not present

## 2020-11-09 DIAGNOSIS — M79671 Pain in right foot: Secondary | ICD-10-CM

## 2020-11-09 DIAGNOSIS — M9908 Segmental and somatic dysfunction of rib cage: Secondary | ICD-10-CM | POA: Diagnosis not present

## 2020-11-09 DIAGNOSIS — M9901 Segmental and somatic dysfunction of cervical region: Secondary | ICD-10-CM

## 2020-11-09 DIAGNOSIS — M7741 Metatarsalgia, right foot: Secondary | ICD-10-CM

## 2020-11-09 DIAGNOSIS — M9902 Segmental and somatic dysfunction of thoracic region: Secondary | ICD-10-CM

## 2020-11-09 DIAGNOSIS — M7740 Metatarsalgia, unspecified foot: Secondary | ICD-10-CM | POA: Insufficient documentation

## 2020-11-09 NOTE — Assessment & Plan Note (Signed)
Patient has a metatarsalgia that seems to be more secondary to the second toe and does have very mild synovitis noted.  Seems to be chronic.  Discussed recovery sandals, over-the-counter orthotics, icing regimen and topical anti-inflammatories.  Worsening symptoms consider injection.  Follow-up with me again in 6 to 8 weeks

## 2020-11-09 NOTE — Patient Instructions (Signed)
You are great  Keep it up  For the foot [employer] orthotics "total support" online would be great  [employer] or [employer] recovery sandals in the house.  Voltaren gel to the toe after activity  See me again in6-8 weeks

## 2020-11-09 NOTE — Assessment & Plan Note (Signed)
Chronic, stable.  Still responding well to osteopathic manipulation.  Discussed posture ergonomics.  Discussed which activities to doing things to avoid.  Increase activity slowly.  Follow-up with me again in 6 to 8 weeks.

## 2020-12-22 ENCOUNTER — Ambulatory Visit: Payer: 59 | Admitting: Family Medicine

## 2021-01-02 ENCOUNTER — Other Ambulatory Visit: Payer: Self-pay | Admitting: Family Medicine

## 2021-01-25 NOTE — Progress Notes (Signed)
[name] [address] [place] [address] Phone: [phone] Subjective:   INadine Counts, am serving as a scribe for Dr. Antoine Primas. This visit occurred during the SARS-CoV-2 public health emergency.  Safety protocols were in place, including screening questions prior to the visit, additional usage of staff PPE, and extensive cleaning of exam room while observing appropriate contact time as indicated for disinfecting solutions.   I'm seeing this patient by the request  of:  McLean-Scocuzza, [name], MD  CC: Neck and back pain  [id number]  Autumn [name] is a 35 y.o. female coming in with complaint of back and neck pain. OMT on 11/09/2020 Patient states right neck main focus. No new complaints.  Patient did have some difficulty when she was at a trampoline park  Medications patient has been prescribed: Vit D  Taking:         Reviewed prior external information including notes and imaging from previsou exam, outside providers and external EMR if available.   As well as notes that were available from care everywhere and other healthcare systems.  Past medical history, social, surgical and family history all reviewed in electronic medical record.  No pertanent information unless stated regarding to the chief complaint.   Past Medical History:  Diagnosis Date   Allergy    Arthritis    GERD (gastroesophageal reflux disease)    Heart palpitations    years ago thought 2/2 stress did not see cardiology was on amitriptyline per pt    History of chicken pox    Hx of migraines     Allergies  Allergen Reactions   Other     Dogs, cats, dust mites and mold       Review of Systems:  No headache, visual changes, nausea, vomiting, diarrhea, constipation, dizziness, abdominal pain, skin rash, fevers, chills, night sweats, weight loss, swollen lymph nodes, body aches, joint swelling, chest pain, shortness of breath, mood changes. POSITIVE  muscle aches  Objective  Blood pressure 120/78, pulse 77, height 5\' 3"  (1.6 m), weight 133 lb (60.3 kg), SpO2 90 %.   General: No apparent distress alert and oriented x3 mood and affect normal, dressed appropriately.  HEENT: Pupils equal, extraocular movements intact  Respiratory: Patient's speak in full sentences and does not appear short of breath  Cardiovascular: No lower extremity edema, non tender, no erythema  Neuro: Cranial nerves II through XII are intact, neurovascularly intact in all extremities with 2+ DTRs and 2+ pulses.  Gait normal with good balance and coordination.  Neck exam does have some loss of lordosis.  Some tenderness to palpation more over the right occipital region.  Patient does have tightness of the right parascapular area as well.  Osteopathic findings  C2 flexed rotated and side bent right C6 flexed rotated and side bent left T3 extended rotated and side bent right inhaled rib T8 extended rotated and side bent left L2 flexed rotated and side bent right Sacrum right on right       Assessment and Plan:  Cervical paraspinal muscle spasm Patient did have an acute exacerbation noted.  Patient did have some tightness noted.  Discussed icing regimen and home exercises.  Increase activity slowly.  Follow-up again in 4 to 8 weeks.   Nonallopathic problems  Decision today to treat with OMT was based on Physical Exam  After verbal consent patient was treated with HVLA, ME, FPR techniques in cervical, rib, thoracic, lumbar, and sacral  areas  Patient tolerated the procedure well with improvement in symptoms  Patient given exercises, stretches and lifestyle modifications  See medications in patient instructions if given  Patient will follow up in 4-8 weeks     The above documentation has been reviewed and is accurate and complete Judi Saa, DO        Note: This dictation was prepared with Dragon dictation along with smaller phrase technology.  Any transcriptional errors that result from this process are unintentional.

## 2021-01-26 ENCOUNTER — Other Ambulatory Visit: Payer: Self-pay

## 2021-01-26 ENCOUNTER — Ambulatory Visit (INDEPENDENT_AMBULATORY_CARE_PROVIDER_SITE_OTHER): Payer: 59 | Admitting: Family Medicine

## 2021-01-26 VITALS — BP 120/78 | HR 77 | Ht 63.0 in | Wt 133.0 lb

## 2021-01-26 DIAGNOSIS — M9903 Segmental and somatic dysfunction of lumbar region: Secondary | ICD-10-CM

## 2021-01-26 DIAGNOSIS — M9901 Segmental and somatic dysfunction of cervical region: Secondary | ICD-10-CM

## 2021-01-26 DIAGNOSIS — M9902 Segmental and somatic dysfunction of thoracic region: Secondary | ICD-10-CM

## 2021-01-26 DIAGNOSIS — M62838 Other muscle spasm: Secondary | ICD-10-CM | POA: Diagnosis not present

## 2021-01-26 DIAGNOSIS — M9904 Segmental and somatic dysfunction of sacral region: Secondary | ICD-10-CM

## 2021-01-26 DIAGNOSIS — M9908 Segmental and somatic dysfunction of rib cage: Secondary | ICD-10-CM | POA: Diagnosis not present

## 2021-01-26 NOTE — Assessment & Plan Note (Signed)
Patient did have an acute exacerbation noted.  Patient did have some tightness noted.  Discussed icing regimen and home exercises.  Increase activity slowly.  Follow-up again in 4 to 8 weeks.

## 2021-01-26 NOTE — Patient Instructions (Signed)
Good to see you! Careful on those trampoline See you again in 2 months

## 2021-03-29 NOTE — Progress Notes (Signed)
[name] [address] [place] [address] Phone: [phone] Subjective:   Bruce Donath, am serving as a scribe for Dr. Antoine Primas.  This visit occurred during the SARS-CoV-2 public health emergency.  Safety protocols were in place, including screening questions prior to the visit, additional usage of staff PPE, and extensive cleaning of exam room while observing appropriate contact time as indicated for disinfecting solutions.  I'm seeing this patient by the request  of:  McLean-Scocuzza, [name], MD  CC: Neck pain and headache follow-up  [id number]  Baillie Mohammad is a 36 y.o. female coming in with complaint of back and neck pain. OMT 01/26/2021. Patient states that she is having pain R side of cervical spine, jaw, and ear. Has been getting botox for migraines. The R side of neck has been weak feeling after the injections.   Medications patient has been prescribed: Vit D  Taking:         Reviewed prior external information including notes and imaging from previsou exam, outside providers and external EMR if available.   As well as notes that were available from care everywhere and other healthcare systems.  Past medical history, social, surgical and family history all reviewed in electronic medical record.  No pertanent information unless stated regarding to the chief complaint.   Past Medical History:  Diagnosis Date   Allergy    Arthritis    GERD (gastroesophageal reflux disease)    Heart palpitations    years ago thought 2/2 stress did not see cardiology was on amitriptyline per pt    History of chicken pox    Hx of migraines     Allergies  Allergen Reactions   Other     Dogs, cats, dust mites and mold       Review of Systems:  No  visual changes, nausea, vomiting, diarrhea, constipation, dizziness, abdominal pain, skin rash, fevers, chills, night sweats, weight loss, swollen lymph nodes, body aches, joint  swelling, chest pain, shortness of breath, mood changes. POSITIVE muscle aches, headache  Objective  Blood pressure 120/80, pulse 71, height 5\' 3"  (1.6 m), weight 137 lb (62.1 kg), SpO2 96 %.   General: No apparent distress alert and oriented x3 mood and affect normal, dressed appropriately.  HEENT: Pupils equal, extraocular movements intact  Respiratory: Patient's speak in full sentences and does not appear short of breath  Cardiovascular: No lower extremity edema, non tender, no erythema  Neck exam still has significant tightness greater than left.  Patient also has tightness in the right parascapular region more than the left.  Some mild scapular dyskinesis bilaterally though.  Osteopathic findings  C2 flexed rotated and side bent right C6 flexed rotated and side bent left T3 extended rotated and side bent right inhaled rib T9 extended rotated and side bent left L2 flexed rotated and side bent right Sacrum right on right       Assessment and Plan:  Cervical paraspinal muscle spasm Patient does have cervicogenic headaches.  Patient has been getting Botox which could be potentially given some mild weakness of the spinalis muscle that could be contributing to some of her aches and pains patient does have.  Spine is extremely well-known to osteophyte manipulation.  Discussed posterolateral dominance.  It did not respond well to low back manipulation today.  Patient will increase activity slowly and follow-up with me again in 6 to 8 weeks otherwise.   Nonallopathic problems  Decision today to treat with  OMT was based on Physical Exam  After verbal consent patient was treated with HVLA, ME, FPR techniques in cervical, rib, thoracic, lumbar, and sacral  areas  Patient tolerated the procedure well with improvement in symptoms  Patient given exercises, stretches and lifestyle modifications  See medications in patient instructions if given  Patient will follow up in 4-8 weeks       The above documentation has been reviewed and is accurate and complete Judi Saa, DO       Note: This dictation was prepared with Dragon dictation along with smaller phrase technology. Any transcriptional errors that result from this process are unintentional.

## 2021-03-30 ENCOUNTER — Other Ambulatory Visit: Payer: Self-pay

## 2021-03-30 ENCOUNTER — Encounter: Payer: Self-pay | Admitting: Family Medicine

## 2021-03-30 ENCOUNTER — Ambulatory Visit (INDEPENDENT_AMBULATORY_CARE_PROVIDER_SITE_OTHER): Payer: 59 | Admitting: Family Medicine

## 2021-03-30 VITALS — BP 120/80 | HR 71 | Ht 63.0 in | Wt 137.0 lb

## 2021-03-30 DIAGNOSIS — M62838 Other muscle spasm: Secondary | ICD-10-CM

## 2021-03-30 DIAGNOSIS — M9902 Segmental and somatic dysfunction of thoracic region: Secondary | ICD-10-CM | POA: Diagnosis not present

## 2021-03-30 DIAGNOSIS — M9908 Segmental and somatic dysfunction of rib cage: Secondary | ICD-10-CM | POA: Diagnosis not present

## 2021-03-30 DIAGNOSIS — M9904 Segmental and somatic dysfunction of sacral region: Secondary | ICD-10-CM

## 2021-03-30 DIAGNOSIS — M9901 Segmental and somatic dysfunction of cervical region: Secondary | ICD-10-CM | POA: Diagnosis not present

## 2021-03-30 DIAGNOSIS — M9903 Segmental and somatic dysfunction of lumbar region: Secondary | ICD-10-CM

## 2021-03-30 NOTE — Assessment & Plan Note (Signed)
Patient does have cervicogenic headaches.  Patient has been getting Botox which could be potentially given some mild weakness of the spinalis muscle that could be contributing to some of her aches and pains patient does have.  Spine is extremely well-known to osteophyte manipulation.  Discussed posterolateral dominance.  It did not respond well to low back manipulation today.  Patient will increase activity slowly and follow-up with me again in 6 to 8 weeks otherwise.

## 2021-03-30 NOTE — Patient Instructions (Signed)
Good to see you Have fun at basketball games See me in 6-8 weeks

## 2021-04-28 ENCOUNTER — Ambulatory Visit (INDEPENDENT_AMBULATORY_CARE_PROVIDER_SITE_OTHER): Payer: 59 | Admitting: Internal Medicine

## 2021-04-28 ENCOUNTER — Encounter: Payer: Self-pay | Admitting: Internal Medicine

## 2021-04-28 ENCOUNTER — Other Ambulatory Visit: Payer: Self-pay

## 2021-04-28 VITALS — BP 119/83 | HR 82 | Temp 98.2°F | Resp 16 | Ht 64.0 in | Wt 133.2 lb

## 2021-04-28 DIAGNOSIS — J309 Allergic rhinitis, unspecified: Secondary | ICD-10-CM | POA: Diagnosis not present

## 2021-04-28 DIAGNOSIS — E559 Vitamin D deficiency, unspecified: Secondary | ICD-10-CM | POA: Diagnosis not present

## 2021-04-28 DIAGNOSIS — R110 Nausea: Secondary | ICD-10-CM

## 2021-04-28 DIAGNOSIS — G43909 Migraine, unspecified, not intractable, without status migrainosus: Secondary | ICD-10-CM | POA: Diagnosis not present

## 2021-04-28 DIAGNOSIS — Z0000 Encounter for general adult medical examination without abnormal findings: Secondary | ICD-10-CM

## 2021-04-28 MED ORDER — ONDANSETRON HCL 4 MG PO TABS: 4.0000 mg | ORAL_TABLET | Freq: Three times a day (TID) | ORAL | 11 refills | Status: DC | PRN

## 2021-04-28 MED ORDER — PROMETHAZINE HCL 25 MG PO TABS: 25.0000 mg | ORAL_TABLET | Freq: Two times a day (BID) | ORAL | 2 refills | Status: DC | PRN

## 2021-04-28 MED ORDER — BUTALBITAL-APAP-CAFFEINE 50-300-40 MG PO CAPS: 1.0000 | ORAL_CAPSULE | ORAL | 3 refills | Status: DC | PRN

## 2021-04-28 MED ORDER — MONTELUKAST SODIUM 10 MG PO TABS: 10.0000 mg | ORAL_TABLET | Freq: Every day | ORAL | 3 refills | Status: AC

## 2021-04-28 MED ORDER — CETIRIZINE HCL 10 MG PO TABS: 10.0000 mg | ORAL_TABLET | Freq: Every day | ORAL | 3 refills | Status: AC | PRN

## 2021-04-28 MED ORDER — SUMATRIPTAN SUCCINATE 100 MG PO TABS: 100.0000 mg | ORAL_TABLET | ORAL | 5 refills | Status: DC | PRN

## 2021-04-28 MED ORDER — PROMETHAZINE HCL 25 MG PO TABS: 25.0000 mg | ORAL_TABLET | Freq: Two times a day (BID) | ORAL | 2 refills | Status: AC | PRN

## 2021-04-28 MED ORDER — FLUTICASONE PROPIONATE 50 MCG/ACT NA SUSP: 2.0000 | Freq: Every day | NASAL | 11 refills | Status: AC | PRN

## 2021-04-28 MED ORDER — CETIRIZINE HCL 10 MG PO TABS: 10.0000 mg | ORAL_TABLET | Freq: Every day | ORAL | 3 refills | Status: DC | PRN

## 2021-04-28 MED ORDER — MONTELUKAST SODIUM 10 MG PO TABS: 10.0000 mg | ORAL_TABLET | Freq: Every day | ORAL | 3 refills | Status: DC

## 2021-04-28 MED ORDER — FLUTICASONE PROPIONATE 50 MCG/ACT NA SUSP: 2.0000 | Freq: Every day | NASAL | 11 refills | Status: DC | PRN

## 2021-04-28 MED ORDER — ONDANSETRON HCL 4 MG PO TABS: 4.0000 mg | ORAL_TABLET | Freq: Three times a day (TID) | ORAL | 11 refills | Status: AC | PRN

## 2021-04-28 NOTE — Patient Instructions (Addendum)
Check insurance whose in network for dermatology   Erenumab injection What is this medication? ERENUMAB (e REN ue mab) is used to prevent migraine headaches. This medicine may be used for other purposes; ask your health care provider or pharmacist if you have questions. COMMON BRAND NAME(S): Aimovig What should I tell my care team before I take this medication? They need to know if you have any of these conditions: an unusual or allergic reaction to erenumab, latex, other medicines, foods, dyes, or preservatives high blood pressure pregnant or trying to get pregnant breast-feeding How should I use this medication? This medicine is for injection under the skin. You will be taught how to prepare and give this medicine. Use exactly as directed. Take your medicine at regular intervals. Do not take your medicine more often than directed. It is important that you put your used needles and syringes in a special sharps container. Do not put them in a trash can. If you do not have a sharps container, call your pharmacist or healthcare provider to get one. Talk to your pediatrician regarding the use of this medicine in children. Special care may be needed. Overdosage: If you think you have taken too much of this medicine contact a poison control center or emergency room at once. NOTE: This medicine is only for you. Do not share this medicine with others. What if I miss a dose? If you miss a dose, take it as soon as you can. If it is almost time for your next dose, take only that dose. Do not take double or extra doses. What may interact with this medication? Interactions are not expected. This list may not describe all possible interactions. Give your health care provider a list of all the medicines, herbs, non-prescription drugs, or dietary supplements you use. Also tell them if you smoke, drink alcohol, or use illegal drugs. Some items may interact with your medicine. What should I watch for while  using this medication? Tell your doctor or healthcare professional if your symptoms do not start to get better or if they get worse. What side effects may I notice from receiving this medication? Side effects that you should report to your doctor or health care professional as soon as possible: allergic reactions like skin rash, itching or hives, swelling of the face, lips, or tongue chest pain fast, irregular heartbeat feeling faint or lightheaded palpitations Side effects that usually do not require medical attention (report these to your doctor or health care professional if they continue or are bothersome): constipation muscle cramps pain, redness, or irritation at site where injected This list may not describe all possible side effects. Call your doctor for medical advice about side effects. You may report side effects to FDA at 1-800-FDA-1088. Where should I keep my medication? Keep out of the reach of children. You will be instructed on how to store this medicine. Throw away any unused medicine after the expiration date on the label. NOTE: This sheet is a summary. It may not cover all possible information. If you have questions about this medicine, talk to your doctor, pharmacist, or health care provider.  2022 Elsevier/Gold Standard (2018-06-24 00:00:00)  Galcanezumab injection*Fremanezumab injection What is this medication? FREMANEZUMAB (fre ma NEZ ue mab) is used to prevent migraine headaches. This medicine may be used for other purposes; ask your health care provider or pharmacist if you have questions. COMMON BRAND NAME(S): AJOVY What should I tell my care team before I take this medication? They need to  know if you have any of these conditions: an unusual or allergic reaction to fremanezumab, other medicines, foods, dyes, or preservatives pregnant or trying to get pregnant breast-feeding How should I use this medication? This medicine is for injection under the skin. You  will be taught how to prepare and give this medicine. Use exactly as directed. Take your medicine at regular intervals. Do not take your medicine more often than directed. It is important that you put your used needles and syringes in a special sharps container. Do not put them in a trash can. If you do not have a sharps container, call your pharmacist or healthcare provider to get one. Talk to your pediatrician regarding the use of this medicine in children. Special care may be needed. Overdosage: If you think you have taken too much of this medicine contact a poison control center or emergency room at once. NOTE: This medicine is only for you. Do not share this medicine with others. What if I miss a dose? If you miss a dose, take it as soon as you can. If it is almost time for your next dose, take only that dose. Do not take double or extra doses. What may interact with this medication? Interactions are not expected. This list may not describe all possible interactions. Give your health care provider a list of all the medicines, herbs, non-prescription drugs, or dietary supplements you use. Also tell them if you smoke, drink alcohol, or use illegal drugs. Some items may interact with your medicine. What should I watch for while using this medication? Tell your doctor or healthcare professional if your symptoms do not start to get better or if they get worse. What side effects may I notice from receiving this medication? Side effects that you should report to your doctor or health care professional as soon as possible: allergic reactions like skin rash, itching or hives, swelling of the face, lips, or tongue Side effects that usually do not require medical attention (report these to your doctor or health care professional if they continue or are bothersome): pain, redness, or irritation at site where injected This list may not describe all possible side effects. Call your doctor for medical advice  about side effects. You may report side effects to FDA at 1-800-FDA-1088. Where should I keep my medication? Keep out of the reach of children. You will be instructed on how to store this medicine. Throw away any unused medicine after the expiration date on the label. NOTE: This sheet is a summary. It may not cover all possible information. If you have questions about this medicine, talk to your doctor, pharmacist, or health care provider.  2022 Elsevier/Gold Standard (2016-11-06 00:00:00)  What is this medication? GALCANEZUMAB (gal ka NEZ ue mab) is used to prevent migraines and treat cluster headaches. This medicine may be used for other purposes; ask your health care provider or pharmacist if you have questions. COMMON BRAND NAME(S): Emgality What should I tell my care team before I take this medication? They need to know if you have any of these conditions: an unusual or allergic reaction to galcanezumab, other medicines, foods, dyes, or preservatives pregnant or trying to get pregnant breast-feeding How should I use this medication? This medicine is for injection under the skin. You will be taught how to prepare and give this medicine. Use exactly as directed. Take your medicine at regular intervals. Do not take your medicine more often than directed. It is important that you put your used  needles and syringes in a special sharps container. Do not put them in a trash can. If you do not have a sharps container, call your pharmacist or healthcare provider to get one. Talk to your pediatrician regarding the use of this medicine in children. Special care may be needed. Overdosage: If you think you have taken too much of this medicine contact a poison control center or emergency room at once. NOTE: This medicine is only for you. Do not share this medicine with others. What if I miss a dose? If you miss a dose, take it as soon as you can. If it is almost time for your next dose, take only that  dose. Do not take double or extra doses. What may interact with this medication? Interactions are not expected. This list may not describe all possible interactions. Give your health care provider a list of all the medicines, herbs, non-prescription drugs, or dietary supplements you use. Also tell them if you smoke, drink alcohol, or use illegal drugs. Some items may interact with your medicine. What should I watch for while using this medication? Tell your doctor or healthcare professional if your symptoms do not start to get better or if they get worse. What side effects may I notice from receiving this medication? Side effects that you should report to your doctor or health care professional as soon as possible: allergic reactions like skin rash, itching or hives, swelling of the face, lips, or tongue Side effects that usually do not require medical attention (report these to your doctor or health care professional if they continue or are bothersome): pain, redness, or irritation at site where injected This list may not describe all possible side effects. Call your doctor for medical advice about side effects. You may report side effects to FDA at 1-800-FDA-1088. Where should I keep my medication? Keep out of the reach of children. You will be instructed on how to store this medicine. Throw away any unused medicine after the expiration date on the label. NOTE: This sheet is a summary. It may not cover all possible information. If you have questions about this medicine, talk to your doctor, pharmacist, or health care provider.  2022 Elsevier/Gold Standard (2017-07-26 00:00:00)

## 2021-04-28 NOTE — Progress Notes (Signed)
Chief Complaint  ?Patient presents with  ? Annual Exam  ? ?Annual  ?1. Migraines needs refills imitex, fiorocet, prn zofran, phenerghan nurtec from Dr. Manuella Ghazi and getting botox Q3 months  ?2. Rec derm will call back for referral if needed  ? ? ?Review of Systems  ?Constitutional:  Negative for weight loss.  ?HENT:  Negative for hearing loss.   ?Eyes:  Negative for blurred vision.  ?Respiratory:  Negative for shortness of breath.   ?Cardiovascular:  Negative for chest pain.  ?Gastrointestinal:  Negative for abdominal pain and blood in stool.  ?Genitourinary:  Negative for dysuria.  ?Musculoskeletal:  Negative for falls and joint pain.  ?Skin:  Negative for rash.  ?Neurological:  Negative for headaches.  ?Psychiatric/Behavioral:  Negative for depression.   ?Past Medical History:  ?Diagnosis Date  ? Allergy   ? Arthritis   ? GERD (gastroesophageal reflux disease)   ? Heart palpitations   ? years ago thought 2/2 stress did not see cardiology was on amitriptyline per pt   ? History of chicken pox   ? Hx of migraines   ? ?Past Surgical History:  ?Procedure Laterality Date  ? TONSILLECTOMY    ? 2001  ? ?Family History  ?Problem Relation Age of Onset  ? Depression Mother   ? Early death Mother   ?     49  ? Heart disease Mother   ?     MVP s/p repair and valve infected   ? Learning disabilities Mother   ? Stroke Mother   ? Cancer Mother   ?     skin  ? Alcohol abuse Father   ? Hyperlipidemia Father   ? Hypertension Father   ? Learning disabilities Father   ? Learning disabilities Sister   ? Depression Sister   ? Cancer Maternal Aunt   ?     skin  ? Cancer Maternal Uncle   ?     skin  ? COPD Maternal Grandmother   ? Diabetes Maternal Grandmother   ? Early death Maternal Grandfather   ? Heart disease Maternal Grandfather   ?     MI age 2   ? Alcohol abuse Paternal Grandmother   ? Cancer Paternal Grandmother   ?     throat   ? COPD Paternal Grandmother   ? Early death Paternal Grandmother   ? Alcohol abuse Paternal Grandfather    ? COPD Paternal Grandfather   ? ?Social History  ? ?Socioeconomic History  ? Marital status: Married  ?  Spouse name: Not on file  ? Number of children: Not on file  ? Years of education: Not on file  ? Highest education level: Not on file  ?Occupational History  ? Not on file  ?Tobacco Use  ? Smoking status: Former  ? Smokeless tobacco: Never  ? Tobacco comments:  ?  smoked x 10 years on and off 5 cig per day   ?Substance and Sexual Activity  ? Alcohol use: Yes  ?  Comment: rare   ? Drug use: Not Currently  ? Sexual activity: Yes  ?Other Topics Concern  ? Not on file  ?Social History Narrative  ? 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 2 sons   ? Married   ? No guns, wears seat belt, safe in relationship   ? ?Social Determinants of Health  ? ?Financial Resource Strain: Not on file  ?Food Insecurity: Not on file  ?Transportation Needs: Not on file  ?  Physical Activity: Not on file  ?Stress: Not on file  ?Social Connections: Not on file  ?Intimate Partner Violence: Not on file  ? ?No outpatient medications have been marked as taking for the 04/28/21 encounter (Office Visit) with McLean-Scocuzza, Nino Glow, MD.  ? ?Allergies  ?Allergen Reactions  ? Other   ?  Dogs, cats, dust mites and mold  ?  ? ?No results found for this or any previous visit (from the past 2160 hour(s)). ?Objective  ?Body mass index is 22.87 kg/m?. ?Wt Readings from Last 3 Encounters:  ?04/28/21 133 lb 4 oz (60.4 kg)  ?03/30/21 137 lb (62.1 kg)  ?01/26/21 133 lb (60.3 kg)  ? ?Temp Readings from Last 3 Encounters:  ?04/28/21 98.2 ?F (36.8 ?C) (Oral)  ?04/27/20 97.9 ?F (36.6 ?C) (Oral)  ?03/24/19 (!) 97 ?F (36.1 ?C) (Temporal)  ? ?BP Readings from Last 3 Encounters:  ?04/28/21 119/83  ?03/30/21 120/80  ?01/26/21 120/78  ? ?Pulse Readings from Last 3 Encounters:  ?04/28/21 82  ?03/30/21 71  ?01/26/21 77  ? ? ?Physical Exam ?Vitals and nursing note reviewed. Exam conducted with a chaperone present.  ?Constitutional:   ?   Appearance: Normal appearance.  She is well-developed and well-groomed.  ?HENT:  ?   Head: Normocephalic and atraumatic.  ?Eyes:  ?   Conjunctiva/sclera: Conjunctivae normal.  ?   Pupils: Pupils are equal, round, and reactive to light.  ?Cardiovascular:  ?   Rate and Rhythm: Normal rate and regular rhythm.  ?   Heart sounds: Normal heart sounds. No murmur heard. ?Pulmonary:  ?   Effort: Pulmonary effort is normal.  ?   Breath sounds: Normal breath sounds.  ?Chest:  ?Breasts: ?   Breasts are symmetrical.  ?   Right: Normal.  ?   Left: Normal.  ?Abdominal:  ?   General: Abdomen is flat. Bowel sounds are normal.  ?   Tenderness: There is no abdominal tenderness.  ?Musculoskeletal:     ?   General: No tenderness.  ?Lymphadenopathy:  ?   Upper Body:  ?   Right upper body: No axillary adenopathy.  ?   Left upper body: No axillary adenopathy.  ?Skin: ?   General: Skin is warm and dry.  ?Neurological:  ?   General: No focal deficit present.  ?   Mental Status: She is alert and oriented to person, place, and time. Mental status is at baseline.  ?   Cranial Nerves: Cranial nerves 2-12 are intact.  ?   Motor: Motor function is intact.  ?   Coordination: Coordination is intact.  ?   Gait: Gait is intact.  ?Psychiatric:     ?   Attention and Perception: Attention and perception normal.     ?   Mood and Affect: Mood and affect normal.     ?   Speech: Speech normal.     ?   Behavior: Behavior normal. Behavior is cooperative.     ?   Thought Content: Thought content normal.     ?   Cognition and Memory: Cognition and memory normal.     ?   Judgment: Judgment normal.  ? ? ?Assessment  ?Plan  ?Annual physical exam - Plan:  ?See below ? ?Migraine without status migrainosus, not intractable, unspecified migraine type - Plan: Butalbital-APAP-Caffeine (FIORICET) 50-300-40 MG CAPS, promethazine (PHENERGAN) 25 MG tablet, SUMAtriptan (IMITREX) 100 MG tablet, ? ?Allergic rhinitis, unspecified seasonality, unspecified trigger - Plan: cetirizine (ZYRTEC) 10 MG tablet,  fluticasone (  FLONASE) 50 MCG/ACT nasal spray, montelukast (SINGULAIR) 10 MG tablet,  ? ?HM ?Last flu shot 2017 declines  ?covid 19 vx declines ?Tdap utd 03/21/16 ?MMR immune  ?Hep B immune  ?covid declines, hep C declines ?HIV neg 05/13/13  ?rec healthy diet and exercise   ?Former smoker  ?IUD out 09/2019 ? -est with Dr. Leonides Schanz 03/24/19 neg neg HPV ?  ?Eye MD [facility] seen 2021/22 vision better ?rec healthy diet and exercise  ?Dermatology has seen 05/02/2018 shave bx right low back lentiginous junctional nevus  ?Provider: Dr. Olivia Mackie McLean-Scocuzza-Internal Medicine  ?

## 2021-05-17 NOTE — Progress Notes (Signed)
?Terrilee Files D.O. ?[facility] ?[address] [place] [address] ?Phone: [phone] ?Subjective:   ?I, Wilford Grist, am serving as a scribe for Dr. Antoine Primas. ? ?This visit occurred during the SARS-CoV-2 public health emergency.  Safety protocols were in place, including screening questions prior to the visit, additional usage of staff PPE, and extensive cleaning of exam room while observing appropriate contact time as indicated for disinfecting solutions.  ? ?I'm seeing this patient by the request  of:  McLean-Scocuzza, [name], MD ? ?CC: neck and back pain  ? ?[id number]  ?Autumn [name] is a 36 y.o. female coming in with complaint of back and neck pain. OMT 03/30/2021. Patient states that she is doing well. No changes since last visit. Overall no pain  ? ?Medications patient has been prescribed: None ? ?Taking: ? ? ?  ? ? ? ? ?Reviewed prior external information including notes and imaging from previsou exam, outside providers and external EMR if available.  ? ?As well as notes that were available from care everywhere and other healthcare systems. ? ?Past medical history, social, surgical and family history all reviewed in electronic medical record.  No pertanent information unless stated regarding to the chief complaint.  ? ?Past Medical History:  ?Diagnosis Date  ? Allergy   ? Arthritis   ? GERD (gastroesophageal reflux disease)   ? Heart palpitations   ? years ago thought 2/2 stress did not see cardiology was on amitriptyline per pt   ? History of chicken pox   ? Hx of migraines   ?  ?Allergies  ?Allergen Reactions  ? Other   ?  Dogs, cats, dust mites and mold  ?  ? ? ? ?Review of Systems: ? No headache, visual changes, nausea, vomiting, diarrhea, constipation, dizziness, abdominal pain, skin rash, fevers, chills, night sweats, weight loss, swollen lymph nodes, body aches, joint swelling, chest pain, shortness of breath, mood changes. POSITIVE muscle aches ? ?Objective  ?Blood pressure  100/72, pulse 77, height 5\' 4"  (1.626 m), weight 136 lb (61.7 kg), SpO2 99 %. ?  ?General: No apparent distress alert and oriented x3 mood and affect normal, dressed appropriately.  ?HEENT: Pupils equal, extraocular movements intact  ?Respiratory: Patient's speak in full sentences and does not appear short of breath  ?Cardiovascular: No lower extremity edema, non tender, no erythema  ?Neck exam still  ? ?Osteopathic findings ? ?C2 flexed rotated and side bent right ?C6 flexed rotated and side bent left ?T3 extended rotated and side bent right inhaled rib ?T9 extended rotated and side bent left ?L3 flexed rotated and side bent right ?Sacrum right on right ? ?  ?Assessment and Plan: ? ?Cervical paraspinal muscle spasm ?Chronic problem with exacerbation  ?Continue what we are doing  And which ones to avoid  ?rtc in 8 weeks   ? ?Nonallopathic problems ? ?Decision today to treat with OMT was based on Physical Exam ? ?After verbal consent patient was treated with HVLA, ME, FPR techniques in cervical, rib, thoracic, lumbar, and sacral  areas ? ?Patient tolerated the procedure well with improvement in symptoms ? ?Patient given exercises, stretches and lifestyle modifications ? ?See medications in patient instructions if given ? ?Patient will follow up in 4-8 weeks ? ?  ? ? ?The above documentation has been reviewed and is accurate and complete , DO ? ? ? ?  ? ? Note: This dictation was prepared with Dragon dictation along with smaller phrase technology. Any transcriptional  errors that result from this process are unintentional.    ?  ?  ? ?

## 2021-05-18 ENCOUNTER — Encounter: Payer: Self-pay | Admitting: Family Medicine

## 2021-05-18 ENCOUNTER — Ambulatory Visit (INDEPENDENT_AMBULATORY_CARE_PROVIDER_SITE_OTHER): Payer: 59 | Admitting: Family Medicine

## 2021-05-18 VITALS — BP 100/72 | HR 77 | Ht 64.0 in | Wt 136.0 lb

## 2021-05-18 DIAGNOSIS — M9908 Segmental and somatic dysfunction of rib cage: Secondary | ICD-10-CM | POA: Diagnosis not present

## 2021-05-18 DIAGNOSIS — M9901 Segmental and somatic dysfunction of cervical region: Secondary | ICD-10-CM

## 2021-05-18 DIAGNOSIS — M9903 Segmental and somatic dysfunction of lumbar region: Secondary | ICD-10-CM

## 2021-05-18 DIAGNOSIS — M62838 Other muscle spasm: Secondary | ICD-10-CM | POA: Diagnosis not present

## 2021-05-18 DIAGNOSIS — M9902 Segmental and somatic dysfunction of thoracic region: Secondary | ICD-10-CM | POA: Diagnosis not present

## 2021-05-18 DIAGNOSIS — M9904 Segmental and somatic dysfunction of sacral region: Secondary | ICD-10-CM | POA: Diagnosis not present

## 2021-05-18 NOTE — Patient Instructions (Signed)
See me in 2-3 months

## 2021-05-18 NOTE — Assessment & Plan Note (Signed)
Chronic problem with exacerbation  ?Continue what we are doing  And which ones to avoid  ?rtc in 8 weeks  ?

## 2021-06-26 NOTE — Progress Notes (Signed)
Pt sch for fasting labs/CPE in 1 yr from last phx ? ?Labs:04/30/22 ?CPE:05/03/22 ?

## 2021-07-05 ENCOUNTER — Telehealth: Payer: Self-pay | Admitting: Family

## 2021-07-05 NOTE — Telephone Encounter (Signed)
Spoke to patient and scheduled her a MCVV with Dr. Olivia Mackie tomorrow at 1:20pm ?

## 2021-07-05 NOTE — Telephone Encounter (Signed)
Call pt ?Can you schedule her with me , mclean or first available provider today/tomorrrow for eval sinus infection? ? ?

## 2021-07-06 ENCOUNTER — Encounter: Payer: Self-pay | Admitting: Internal Medicine

## 2021-07-06 ENCOUNTER — Telehealth (INDEPENDENT_AMBULATORY_CARE_PROVIDER_SITE_OTHER): Payer: 59 | Admitting: Internal Medicine

## 2021-07-06 DIAGNOSIS — J0110 Acute frontal sinusitis, unspecified: Secondary | ICD-10-CM

## 2021-07-06 MED ORDER — AZITHROMYCIN 250 MG PO TABS: ORAL_TABLET | ORAL | 0 refills | Status: AC

## 2021-07-06 NOTE — Progress Notes (Addendum)
Telephone Note  I connected with Autumn [name]   on 07/06/21 at  1:40 PM EDT by a video enabled telemedicine application and verified that I am speaking with the correct person using two identifiers.  Location patient: Mobile Location provider:work or home office Persons participating in the virtual visit: patient, provider  I discussed the limitations and requested verbal permission for telemedicine visit. The patient expressed understanding and agreed to proceed.   HPI:  Acute telemedicine visit for : No f/u ent in 10 years tonsils and adenoids out at 36 y.o last Tuesday had nasal congestion,dry cough, no fever, tried zyrtec, flonase, sudafed w/o relief and ns teals, singulair+pnd. She has eye pressure and throat discomfort  Not tested for covid  Est Dr. Kathyrn Sheriff years ago  She did allergy shots in her 2s no difference and it was a "pain"and uncomfortable had a cat and go   -Pertinent past medical history: see below -Pertinent medication allergies: Allergies  Allergen Reactions   Other     Dogs, cats, dust mites and mold     -COVID-19 vaccine status:  Immunization History  Administered Date(s) Administered   HPV Quadrivalent 04/19/2004, 04/19/2004   Hepatitis B 10/21/1994   Hepatitis B, adult 10/21/1994   Influenza Inj Mdck Quad Pf 12/02/2015   Influenza, Seasonal, Injecte, Preservative Fre 11/09/2013   Influenza-Unspecified 12/21/2013, 12/02/2015   MMR 02/20/1984   Tdap 10/07/2013, 03/21/2016     ROS: See pertinent positives and negatives per HPI.  Past Medical History:  Diagnosis Date   Allergy    Arthritis    GERD (gastroesophageal reflux disease)    Heart palpitations    years ago thought 2/2 stress did not see cardiology was on amitriptyline per pt    History of chicken pox    Hx of migraines     Past Surgical History:  Procedure Laterality Date   TONSILLECTOMY     2001     Current Outpatient Medications:    Butalbital-APAP-Caffeine (FIORICET)  50-300-40 MG CAPS, Take 1-2 tablets by mouth every 4 (four) hours as needed., Disp: 90 capsule, Rfl: 3   cetirizine (ZYRTEC) 10 MG tablet, Take 1 tablet (10 mg total) by mouth daily as needed for allergies., Disp: 90 tablet, Rfl: 3   fluticasone (FLONASE) 50 MCG/ACT nasal spray, Place 2 sprays into both nostrils daily as needed for allergies or rhinitis., Disp: 16 g, Rfl: 11   montelukast (SINGULAIR) 10 MG tablet, Take 1 tablet (10 mg total) by mouth at bedtime., Disp: 90 tablet, Rfl: 3   ondansetron (ZOFRAN) 4 MG tablet, Take 1 tablet (4 mg total) by mouth every 8 (eight) hours as needed for nausea or vomiting., Disp: 40 tablet, Rfl: 11   promethazine (PHENERGAN) 25 MG tablet, Take 1 tablet (25 mg total) by mouth 2 (two) times daily as needed for nausea or vomiting., Disp: 30 tablet, Rfl: 2   SUMAtriptan (IMITREX) 100 MG tablet, Take 1 tablet (100 mg total) by mouth every 2 (two) hours as needed for migraine. Max 200/24 hrs, Disp: 10 tablet, Rfl: 5   Vitamin D, Ergocalciferol, (DRISDOL) 1.25 MG (50000 UNIT) CAPS capsule, TAKE 1 CAPSULE BY MOUTH EVERY 7 DAYS, Disp: 12 capsule, Rfl: 0   azithromycin (ZITHROMAX) 250 MG tablet, With food Take 2 tablets on day 1, then 1 tablet daily on days 2 through 5, Disp: 6 tablet, Rfl: 0   Rimegepant Sulfate (NURTEC) 75 MG TBDP, Take by mouth. (Patient not taking: Reported on 07/06/2021), Disp: , Rfl:  EXAM:  VITALS per patient if applicable:  GENERAL: alert, oriented, appears well and in no acute distress   PSYCH/NEURO: pleasant and cooperative, no obvious depression or anxiety, speech and thought processing grossly intact  ASSESSMENT AND PLAN:  Discussed the following assessment and plan:  Acute non-recurrent frontal sinusitis - Plan: azithromycin (ZITHROMAX) 250 MG tablet Consider Dr. Kathyrn Sheriff or Tami Ribas f/u if needed for recurrence  zyrtec, flonase, sudafed w/o relief and ns teals, singulair -we discussed possible serious and likely etiologies, options  for evaluation and workup, limitations of telemedicine visit vs in person visit, treatment, treatment risks and precautions. Pt is agreeable to treatment via telemedicine at this moment.   I discussed the assessment and treatment plan with the patient. The patient was provided an opportunity to ask questions and all were answered. The patient agreed with the plan and demonstrated an understanding of the instructions.    Time spent 15 minutes Delorise Jackson, MD

## 2021-07-06 NOTE — Patient Instructions (Signed)
These are over the counter medication options:  °Mucinex dm green label for cough or robitussin DM  °Multivitamin or below vitamins  °Vitamin C 1000 mg daily.  °Vitamin D3 4000 Iu (units) daily.  °Zinc 100 mg daily.  °Quercetin 250-500 mg 2 times per day   °Elderberry  °Oil of oregano  °cepacol or chloroseptic spray °Warm salt water gargles +hydrogen peroxide °Sugar free cough drops  °Warm tea with honey and lemon  °Hydration  °Try to eat though you dont feel like it   °Tylenol or Advil  °Nasal saline and Flonase 2 sprays nasal congestion  °If sneezing/runny nose over the counter allergy pill claritin,allegra, zyrtec, xyzal °  °

## 2021-07-18 NOTE — Progress Notes (Unsigned)
[facility] [place] [address] [place] Phone: [phone] Subjective:   IVilma Meckel, am serving as a scribe for Dr. Hulan Saas.  I'm seeing this patient by the request  of:  McLean-Scocuzza, Nino Glow, MD  CC: Back and neck pain follow-up  [id number]  Autumn [name] is a 36 y.o. female coming in with complaint of back and neck pain. OMT 05/18/2021. Patient states same per usual. No new complaints.  Medications patient has been prescribed: None  Taking:         Reviewed prior external information including notes and imaging from previsou exam, outside providers and external EMR if available.   As well as notes that were available from care everywhere and other healthcare systems.  Past medical history, social, surgical and family history all reviewed in electronic medical record.  No pertanent information unless stated regarding to the chief complaint.   Past Medical History:  Diagnosis Date   Allergy    Arthritis    GERD (gastroesophageal reflux disease)    Heart palpitations    years ago thought 2/2 stress did not see cardiology was on amitriptyline per pt    History of chicken pox    Hx of migraines     Allergies  Allergen Reactions   Other     Dogs, cats, dust mites and mold       Review of Systems:  No headache, visual changes, nausea, vomiting, diarrhea, constipation, dizziness, abdominal pain, skin rash, fevers, chills, night sweats, weight loss, swollen lymph nodes, body aches, joint swelling, chest pain, shortness of breath, mood changes. POSITIVE muscle aches  Objective  Blood pressure 102/72, pulse 66, height 5\' 4"  (1.626 m), weight 130 lb (59 kg), SpO2 97 %.   General: No apparent distress alert and oriented x3 mood and affect normal, dressed appropriately.  HEENT: Pupils equal, extraocular movements intact  Respiratory: Patient's speak in full sentences and does not appear short of breath   Cardiovascular: No lower extremity edema, non tender, no erythema  Neck exam still has some mild loss lordosis.  Tightness noted in the parascapular region noted right greater than left.  Patient does have some limited sidebending on the right.  Patient does have full flexion and extension.  Osteopathic findings  C2 flexed rotated and side bent right C6 flexed rotated and side bent left T3 extended rotated and side bent right inhaled rib T9 extended rotated and side bent left       Assessment and Plan:  Cervical paraspinal muscle spasm Chronic problem but overall seems to be improving.  Discussed icing regimen and home exercises.  Patient is going to continue with the core strengthening.  Patient will continue to watch posture.  Has been making significant strides over the course of time and hopefully patient will continue to.  Follow-up with me again12 weeks    Nonallopathic problems  Decision today to treat with OMT was based on Physical Exam  After verbal consent patient was treated with HVLA, ME, FPR techniques in cervical, rib, thoracic,areas  Patient tolerated the procedure well with improvement in symptoms  Patient given exercises, stretches and lifestyle modifications  See medications in patient instructions if given  Patient will follow up in 12 weeks      The above documentation has been reviewed and is accurate and complete Lyndal Pulley, DO        Note: This dictation was prepared with Dragon dictation along with smaller phrase technology.  Any transcriptional errors that result from this process are unintentional.

## 2021-07-19 ENCOUNTER — Ambulatory Visit: Payer: 59 | Admitting: Family Medicine

## 2021-07-19 VITALS — BP 102/72 | HR 66 | Ht 64.0 in | Wt 130.0 lb

## 2021-07-19 DIAGNOSIS — M9902 Segmental and somatic dysfunction of thoracic region: Secondary | ICD-10-CM

## 2021-07-19 DIAGNOSIS — M9901 Segmental and somatic dysfunction of cervical region: Secondary | ICD-10-CM

## 2021-07-19 DIAGNOSIS — M62838 Other muscle spasm: Secondary | ICD-10-CM

## 2021-07-19 DIAGNOSIS — M9908 Segmental and somatic dysfunction of rib cage: Secondary | ICD-10-CM

## 2021-07-19 NOTE — Assessment & Plan Note (Signed)
Chronic problem but overall seems to be improving.  Discussed icing regimen and home exercises.  Patient is going to continue with the core strengthening.  Patient will continue to watch posture.  Has been making significant strides over the course of time and hopefully patient will continue to.  Follow-up with me again12 weeks

## 2021-07-19 NOTE — Patient Instructions (Signed)
Good to see you! Have a wonderful summer Stay active See you again in 10-12 weeks

## 2021-07-28 ENCOUNTER — Encounter: Payer: Self-pay | Admitting: Internal Medicine

## 2021-07-28 ENCOUNTER — Ambulatory Visit (INDEPENDENT_AMBULATORY_CARE_PROVIDER_SITE_OTHER): Payer: 59 | Admitting: Internal Medicine

## 2021-07-28 VITALS — BP 110/80 | HR 90 | Temp 98.2°F | Resp 14 | Ht 64.0 in | Wt 129.2 lb

## 2021-07-28 DIAGNOSIS — Z1283 Encounter for screening for malignant neoplasm of skin: Secondary | ICD-10-CM

## 2021-07-28 DIAGNOSIS — L720 Epidermal cyst: Secondary | ICD-10-CM | POA: Diagnosis not present

## 2021-07-28 MED ORDER — CLINDAMYCIN HCL 300 MG PO CAPS
300.0000 mg | ORAL_CAPSULE | Freq: Three times a day (TID) | ORAL | 0 refills | Status: DC
Start: 2021-07-28 — End: 2021-11-28

## 2021-07-28 NOTE — Patient Instructions (Addendum)
Dr. Roseanne Kaufman   [address] [place], [place], [place] [address] Phone: [phone] Epidermoid Cyst  An epidermoid cyst, also known as epidermal cyst, is a sac made of skin tissue. The sac contains a substance called keratin. Keratin is a protein that is normally secreted through the hair follicles. When keratin becomes trapped in the top layer of skin (epidermis), it can form an epidermoid cyst. Epidermoid cysts can be found anywhere on your body. These cysts are usually harmless (benign), and they may not cause symptoms unless they become inflamed or infected. What are the causes? This condition may be caused by: A blocked hair follicle. A hair that curls and re-enters the skin instead of growing straight out of the skin (ingrown hair). A blocked pore. Irritated skin. An injury to the skin. Certain conditions that are passed along from parent to child (inherited). Human papillomavirus (HPV). This happens rarely when cysts occur on the bottom of the feet. Long-term (chronic) sun damage to the skin. What increases the risk? The following factors may make you more likely to develop an epidermoid cyst: Having acne. Being female. Having an injury to the skin. Being past puberty. Having certain rare genetic disorders. What are the signs or symptoms? The only symptom of this condition may be a small, painless lump underneath the skin. When an epidermal cyst ruptures, it may become inflamed. True infection in cysts is rare. Symptoms may include: Redness. Inflammation. Tenderness. Warmth. Keratin draining from the cyst. Keratin is grayish-white, bad-smelling substance. Pus draining from the cyst. How is this diagnosed? This condition is diagnosed with a physical exam. In some cases, you may have a sample of tissue (biopsy) taken from your cyst to be examined under a microscope or tested for bacteria. You may be referred to a health care provider who specializes in skin care (dermatologist). How is  this treated? If a cyst becomes inflamed, treatment may include: Opening and draining the cyst, done by a health care provider. After draining, minor surgery to remove the rest of the cyst may be done. Taking antibiotic medicine. Having injections of medicines (steroids) that help to reduce inflammation. Having surgery to remove the cyst. Surgery may be done if the cyst: Becomes large. Bothers you. Has a chance of turning into cancer. Do not try to open a cyst yourself. Follow these instructions at home: Medicines If you were prescribed an antibiotic medicine, take it it as told by your health care provider. Do not stop using the antibiotic even if you start to feel better. Take over-the-counter and prescription medicines only as told by your health care provider. General instructions Keep the area around your cyst clean and dry. Wear loose, dry clothing. Avoid touching your cyst. Check your cyst every day for signs of infection. Check for: Redness, swelling, or pain. Fluid or blood. Warmth. Pus or a bad smell. Keep all follow-up visits. This is important. How is this prevented? Wear clean, dry, clothing. Avoid wearing tight clothing. Keep your skin clean and dry. Take showers or baths every day. Contact a health care provider if: Your cyst develops symptoms of infection. Your condition is not improving or is getting worse. You develop a cyst that looks different from other cysts you have had. You have a fever. Get help right away if: Redness spreads from the cyst into the surrounding area. Summary An epidermoid cyst is a sac made of skin tissue. These cysts are usually harmless (benign), and they may not cause symptoms unless they become inflamed.  If a cyst becomes inflamed, treatment may include surgery to open and drain the cyst, or to remove it. Treatment may also include medicines by mouth or through an injection. Take over-the-counter and prescription medicines only as  told by your health care provider. If you were prescribed an antibiotic medicine, take it as told by your health care provider. Do not stop using the antibiotic even if you start to feel better. Contact a health care provider if your condition is not improving or is getting worse. Keep all follow-up visits as told by your health care provider. This is important. This information is not intended to replace advice given to you by your health care provider. Make sure you discuss any questions you have with your health care provider. Document Revised: 05/13/2019 Document Reviewed: 05/13/2019 Elsevier Patient Education  2023 [employer].

## 2021-07-28 NOTE — Progress Notes (Signed)
Chief Complaint  Patient presents with   Cyst    Pt c/o inflamed cyst in R arm, has been increasing in size this wk, c/o pain,redness, & swelling.   F/u  1. Right posterior arm cyst x 6 months was a blackhead and she extracted it white contents and Tuesday area red and increased size and now swelling extends below 5/10 painful going to [employer] this weekend     Review of Systems  Constitutional:  Negative for weight loss.  HENT:  Negative for hearing loss.   Eyes:  Negative for blurred vision.  Respiratory:  Negative for shortness of breath.   Cardiovascular:  Negative for chest pain.  Gastrointestinal:  Negative for abdominal pain and blood in stool.  Genitourinary:  Negative for dysuria.  Musculoskeletal:  Negative for falls and joint pain.  Skin:  Negative for rash.  Neurological:  Negative for headaches.  Psychiatric/Behavioral:  Negative for depression.    Past Medical History:  Diagnosis Date   Allergy    Arthritis    GERD (gastroesophageal reflux disease)    Heart palpitations    years ago thought 2/2 stress did not see cardiology was on amitriptyline per pt    History of chicken pox    Hx of migraines    Past Surgical History:  Procedure Laterality Date   TONSILLECTOMY     2001   Family History  Problem Relation Age of Onset   Depression Mother    Early death Mother        30   Heart disease Mother        MVP s/p repair and valve infected    Learning disabilities Mother    Stroke Mother    Cancer Mother        skin   Alcohol abuse Father    Hyperlipidemia Father    Hypertension Father    Learning disabilities Father    Learning disabilities Sister    Depression Sister    Cancer Maternal Aunt        skin   Cancer Maternal Uncle        skin   COPD Maternal Grandmother    Diabetes Maternal Grandmother    Early death Maternal Grandfather    Heart disease Maternal Grandfather        MI age 65    Alcohol abuse Paternal Grandmother    Cancer  Paternal Grandmother        throat    COPD Paternal Grandmother    Early death Paternal Grandmother    Alcohol abuse Paternal Grandfather    COPD Paternal Grandfather    Social History   Socioeconomic History   Marital status: Married    Spouse name: Not on file   Number of children: Not on file   Years of education: Not on file   Highest education level: Not on file  Occupational History   Not on file  Tobacco Use   Smoking status: Former   Smokeless tobacco: Never   Tobacco comments:    smoked x 10 years on and off 5 cig per day   Substance and Sexual Activity   Alcohol use: Yes    Comment: rare    Drug use: Not Currently   Sexual activity: Yes  Other Topics Concern   Not on file  Social History Narrative   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   cetirizine (ZYRTEC) 10 MG tablet Take 1 tablet (10 mg total) by mouth daily as needed for allergies.   clindamycin (CLEOCIN) 300 MG capsule Take 1 capsule (300 mg total) by mouth 3 (three) times daily. With food x 7-10 days   fluticasone (FLONASE) 50 MCG/ACT nasal spray Place 2 sprays into both nostrils daily as needed for allergies or rhinitis.   montelukast (SINGULAIR) 10 MG tablet Take 1 tablet (10 mg total) by mouth at bedtime.   ondansetron (ZOFRAN) 4 MG tablet Take 1 tablet (4 mg total) by mouth every 8 (eight) hours as needed for nausea or vomiting.   promethazine (PHENERGAN) 25 MG tablet Take 1 tablet (25 mg total) by mouth 2 (two) times  daily as needed for nausea or vomiting.   Rimegepant Sulfate (NURTEC) 75 MG TBDP Take by mouth.   SUMAtriptan (IMITREX) 100 MG tablet Take 1 tablet (100 mg total) by mouth every 2 (two) hours as needed for migraine. Max 200/24 hrs   Vitamin D, Ergocalciferol, (DRISDOL) 1.25 MG (50000 UNIT) CAPS capsule TAKE 1 CAPSULE BY MOUTH EVERY 7 DAYS   Allergies  Allergen Reactions   Other     Dogs, cats, dust mites and mold     No results found for this or any previous visit (from the past 2160 hour(s)). Objective  Body mass index is 22.18 kg/m. Wt Readings from Last 3 Encounters:  07/28/21 129 lb 3.2 oz (58.6 kg)  07/19/21 130 lb (59 kg)  05/18/21 136 lb (61.7 kg)   Temp Readings from Last 3 Encounters:  07/28/21 98.2 F (36.8 C) (Oral)  04/28/21 98.2 F (36.8 C) (Oral)  04/27/20 97.9 F (36.6 C) (Oral)   BP Readings from Last 3 Encounters:  07/28/21 110/80  07/19/21 102/72  05/18/21 100/72   Pulse Readings from Last 3 Encounters:  07/28/21 90  07/19/21 66  05/18/21 77    Physical Exam Vitals and nursing note reviewed.  Constitutional:      Appearance: Normal appearance. She is well-developed and well-groomed.  HENT:     Head: Normocephalic and atraumatic.  Eyes:     Conjunctiva/sclera: Conjunctivae normal.     Pupils: Pupils are equal, round, and reactive to light.  Cardiovascular:     Rate and Rhythm: Normal rate and regular rhythm.     Heart sounds: Normal heart sounds. No murmur heard. Pulmonary:     Effort: Pulmonary effort is normal.     Breath sounds: Normal breath sounds.  Abdominal:     General: Abdomen is flat. Bowel sounds are normal.     Tenderness: There is no abdominal tenderness.  Musculoskeletal:        General: No tenderness.  Skin:    General: Skin is warm and dry.       Neurological:     General: No focal deficit present.     Mental Status: She is alert and oriented to person, place, and time. Mental status is at baseline.     Cranial Nerves:  Cranial nerves 2-12 are  intact.     Motor: Motor function is intact.     Coordination: Coordination is intact.     Gait: Gait is intact.  Psychiatric:        Attention and Perception: Attention and perception normal.        Mood and Affect: Mood and affect normal.        Speech: Speech normal.        Behavior: Behavior normal. Behavior is cooperative.        Thought Content: Thought content normal.        Cognition and Memory: Cognition and memory normal.        Judgment: Judgment normal.     Assessment  Plan  Epidermal inclusion cyst - Plan: clindamycin (CLEOCIN) 300 MG capsule tid x 7-10 days with food, Ambulatory referral to Dermatology consider excision as cyst is inflamed  Skin cancer screening - Plan: Ambulatory referral to Dermatology   Hm Last flu shot 2017 declines  covid 19 vx declines Tdap utd 03/21/16 MMR immune  Hep B immune  covid declines, hep C declines HIV neg 05/13/13  rec healthy diet and exercise   Former smoker  IUD out 09/2019  -est with Dr. Leonides Schanz 03/24/19 neg neg HPV   Eye MD [facility] seen 2021/22 vision better rec healthy diet and exercise  Dermatology has seen 05/02/2018 shave bx right low back lentiginous junctional nevus  Provider: Dr. Olivia Mackie McLean-Scocuzza-Internal Medicine

## 2021-08-03 ENCOUNTER — Other Ambulatory Visit: Payer: 59

## 2021-10-03 NOTE — Progress Notes (Unsigned)
[name] [address] [place] [address] Phone: [phone] Subjective:   Autumn [name], am serving as a scribe for Dr. Antoine Primas.  I'm seeing this patient by the request  of:  McLean-Scocuzza, [name], MD  CC: back and neck pain follow up   [id number]  Eldana Isip is a 36 y.o. female coming in with complaint of back and neck pain. OMT 07/19/2021. Patient states that she has been doing well since last visit.  Patient had mild strain noted of the lower back she states.  Just happened today while she was doing exercises.  Medications patient has been prescribed: None  Taking:         Reviewed prior external information including notes and imaging from previsou exam, outside providers and external EMR if available.   As well as notes that were available from care everywhere and other healthcare systems.  Past medical history, social, surgical and family history all reviewed in electronic medical record.  No pertanent information unless stated regarding to the chief complaint.   Past Medical History:  Diagnosis Date   Allergy    Arthritis    GERD (gastroesophageal reflux disease)    Heart palpitations    years ago thought 2/2 stress did not see cardiology was on amitriptyline per pt    History of chicken pox    Hx of migraines     Allergies  Allergen Reactions   Other     Dogs, cats, dust mites and mold       Review of Systems:  No headache, visual changes, nausea, vomiting, diarrhea, constipation, dizziness, abdominal pain, skin rash, fevers, chills, night sweats, weight loss, swollen lymph nodes, body aches, joint swelling, chest pain, shortness of breath, mood changes. POSITIVE muscle aches  Objective  Blood pressure 110/82, pulse 69, height 5\' 4"  (1.626 m), weight 133 lb (60.3 kg), SpO2 99 %.   General: No apparent distress alert and oriented x3 mood and affect normal, dressed appropriately.  HEENT: Pupils  equal, extraocular movements intact  Respiratory: Patient's speak in full sentences and does not appear short of breath  Cardiovascular: No lower extremity edema, non tender, no erythema  Gait MSK:  Back  Tightness noted in the left parascapular area.  Patient also has tightness in the right lumbar spine.  Different than patient's usual.   Osteopathic findings  C2 flexed rotated and side bent right C5 flexed rotated and side bent left T3 extended rotated and side bent left inhaled rib T9 extended rotated and side bent left L2 flexed rotated and side bent right Sacrum right on right     Assessment and Plan:  Cervical paraspinal muscle spasm Patient does have tightness still intermittently.  Nothing that is stopping her from activity.  Responding extremely well to osteopathic manipulation.  Follow-up with me again in 2-3 months    Nonallopathic problems  Decision today to treat with OMT was based on Physical Exam  After verbal consent patient was treated with HVLA, ME, FPR techniques in cervical, rib, thoracic, lumbar, and sacral  areas  Patient tolerated the procedure well with improvement in symptoms  Patient given exercises, stretches and lifestyle modifications  See medications in patient instructions if given  Patient will follow up in 4-8 weeks    The above documentation has been reviewed and is accurate and complete , DO          Note: This dictation was prepared with Dragon dictation along  with smaller phrase technology. Any transcriptional errors that result from this process are unintentional.

## 2021-10-04 ENCOUNTER — Ambulatory Visit (INDEPENDENT_AMBULATORY_CARE_PROVIDER_SITE_OTHER): Payer: 59 | Admitting: Family Medicine

## 2021-10-04 VITALS — BP 110/82 | HR 69 | Ht 64.0 in | Wt 133.0 lb

## 2021-10-04 DIAGNOSIS — M9903 Segmental and somatic dysfunction of lumbar region: Secondary | ICD-10-CM

## 2021-10-04 DIAGNOSIS — M9908 Segmental and somatic dysfunction of rib cage: Secondary | ICD-10-CM

## 2021-10-04 DIAGNOSIS — M9904 Segmental and somatic dysfunction of sacral region: Secondary | ICD-10-CM

## 2021-10-04 DIAGNOSIS — M9901 Segmental and somatic dysfunction of cervical region: Secondary | ICD-10-CM

## 2021-10-04 DIAGNOSIS — M9902 Segmental and somatic dysfunction of thoracic region: Secondary | ICD-10-CM

## 2021-10-04 DIAGNOSIS — M62838 Other muscle spasm: Secondary | ICD-10-CM | POA: Diagnosis not present

## 2021-10-04 NOTE — Patient Instructions (Signed)
See me in 2-3 months

## 2021-10-04 NOTE — Assessment & Plan Note (Signed)
Patient does have tightness still intermittently.  Nothing that is stopping her from activity.  Responding extremely well to osteopathic manipulation.  Follow-up with me again in 2-3 months

## 2021-11-28 ENCOUNTER — Telehealth: Payer: Self-pay | Admitting: Nurse Practitioner

## 2021-11-28 DIAGNOSIS — J0140 Acute pansinusitis, unspecified: Secondary | ICD-10-CM

## 2021-11-28 MED ORDER — AMOXICILLIN-POT CLAVULANATE 875-125 MG PO TABS: 1.0000 | ORAL_TABLET | Freq: Two times a day (BID) | ORAL | 0 refills | Status: AC

## 2021-11-28 NOTE — Progress Notes (Signed)
Virtual Visit Consent   Marlies Minck, you are scheduled for a virtual visit with a [facility] provider today. Just as with appointments in the office, your consent must be obtained to participate. Your consent will be active for this visit and any virtual visit you may have with one of our providers in the next 365 days. If you have a MyChart account, a copy of this consent can be sent to you electronically.  As this is a virtual visit, video technology does not allow for your provider to perform a traditional examination. This may limit your provider's ability to fully assess your condition. If your provider identifies any concerns that need to be evaluated in person or the need to arrange testing (such as labs, EKG, etc.), we will make arrangements to do so. Although advances in technology are sophisticated, we cannot ensure that it will always work on either your end or our end. If the connection with a video visit is poor, the visit may have to be switched to a telephone visit. With either a video or telephone visit, we are not always able to ensure that we have a secure connection.  By engaging in this virtual visit, you consent to the provision of healthcare and authorize for your insurance to be billed (if applicable) for the services provided during this visit. Depending on your insurance coverage, you may receive a charge related to this service.  I need to obtain your verbal consent now. Are you willing to proceed with your visit today? Sande Keats has provided verbal consent on 11/28/2021 for a virtual visit (video or telephone). Apolonio Schneiders, FNP  Date: 11/28/2021 4:46 PM  Virtual Visit via Video Note   I, Apolonio Schneiders, connected with  Autumn [name]  ([id number], 01/21/1986) on 11/28/21 at  4:45 PM EDT by a video-enabled telemedicine application and verified that I am speaking with the correct person using two identifiers.  Location: Patient: Virtual Visit Location Patient:  Home Provider: Virtual Visit Location Provider: Home Office   I discussed the limitations of evaluation and management by telemedicine and the availability of in person appointments. The patient expressed understanding and agreed to proceed.    History of Present Illness: Autumn [name] is a 36 y.o. who identifies as a female who was assigned female at birth, and is being seen today with complaints of sinus symptoms for the past week. Started with nasal congestion that improved and then worsened over the past two days. Feels her congestion is stuck now, glands feel tender and swollen  Has a sinus headache today   Stays on Zyrtec Flonase and was on Singulair in the past  Problems:  Patient Active Problem List   Diagnosis Date Noted   Metatarsalgia 11/09/2020   Cervical paraspinal muscle spasm 10/03/2020   Nonallopathic lesion of cervical region 03/31/2019   Nonallopathic lesion of thoracic region 03/31/2019   Nonallopathic lesion of rib cage 03/31/2019   Annual physical exam 03/20/2018   Migraine 08/13/2017   Family history of skin cancer 08/13/2017   Allergic rhinitis 08/13/2017    Allergies:  Allergies  Allergen Reactions   Other     Dogs, cats, dust mites and mold     Medications:  Current Outpatient Medications:    Butalbital-APAP-Caffeine (FIORICET) 50-300-40 MG CAPS, Take 1-2 tablets by mouth every 4 (four) hours as needed., Disp: 90 capsule, Rfl: 3   cetirizine (ZYRTEC) 10 MG tablet, Take 1 tablet (10 mg total) by mouth daily as needed for allergies., Disp:  90 tablet, Rfl: 3   clindamycin (CLEOCIN) 300 MG capsule, Take 1 capsule (300 mg total) by mouth 3 (three) times daily. With food x 7-10 days, Disp: 30 capsule, Rfl: 0   fluticasone (FLONASE) 50 MCG/ACT nasal spray, Place 2 sprays into both nostrils daily as needed for allergies or rhinitis., Disp: 16 g, Rfl: 11   montelukast (SINGULAIR) 10 MG tablet, Take 1 tablet (10 mg total) by mouth at bedtime., Disp: 90 tablet,  Rfl: 3   ondansetron (ZOFRAN) 4 MG tablet, Take 1 tablet (4 mg total) by mouth every 8 (eight) hours as needed for nausea or vomiting., Disp: 40 tablet, Rfl: 11   promethazine (PHENERGAN) 25 MG tablet, Take 1 tablet (25 mg total) by mouth 2 (two) times daily as needed for nausea or vomiting., Disp: 30 tablet, Rfl: 2   Rimegepant Sulfate (NURTEC) 75 MG TBDP, Take by mouth., Disp: , Rfl:    SUMAtriptan (IMITREX) 100 MG tablet, Take 1 tablet (100 mg total) by mouth every 2 (two) hours as needed for migraine. Max 200/24 hrs, Disp: 10 tablet, Rfl: 5   Vitamin D, Ergocalciferol, (DRISDOL) 1.25 MG (50000 UNIT) CAPS capsule, TAKE 1 CAPSULE BY MOUTH EVERY 7 DAYS, Disp: 12 capsule, Rfl: 0  Observations/Objective: Patient is well-developed, well-nourished in no acute distress.  Resting comfortably  at home.  Head is normocephalic, atraumatic.  No labored breathing.  Speech is clear and coherent with logical content.  Patient is alert and oriented at baseline.    Assessment and Plan: 1. Acute non-recurrent pansinusitis  - amoxicillin-clavulanate (AUGMENTIN) 875-125 MG tablet; Take 1 tablet by mouth 2 (two) times daily for 7 days. Take with food  Dispense: 14 tablet; Refill: 0    Remain on allergy regimen as discussed   Follow Up Instructions: I discussed the assessment and treatment plan with the patient. The patient was provided an opportunity to ask questions and all were answered. The patient agreed with the plan and demonstrated an understanding of the instructions.  A copy of instructions were sent to the patient via MyChart unless otherwise noted below.   The patient was advised to call back or seek an in-person evaluation if the symptoms worsen or if the condition fails to improve as anticipated.  Time:  I spent 10 minutes with the patient via telehealth technology discussing the above problems/concerns.    Apolonio Schneiders, FNP

## 2021-12-08 ENCOUNTER — Ambulatory Visit: Payer: 59 | Admitting: Family Medicine

## 2022-04-27 ENCOUNTER — Ambulatory Visit: Payer: 59 | Admitting: Family Medicine

## 2022-04-27 ENCOUNTER — Encounter: Payer: Self-pay | Admitting: Family Medicine

## 2022-04-27 VITALS — BP 122/84 | HR 64 | Ht 64.0 in

## 2022-04-27 DIAGNOSIS — M9902 Segmental and somatic dysfunction of thoracic region: Secondary | ICD-10-CM | POA: Diagnosis not present

## 2022-04-27 DIAGNOSIS — M62838 Other muscle spasm: Secondary | ICD-10-CM

## 2022-04-27 DIAGNOSIS — G43719 Chronic migraine without aura, intractable, without status migrainosus: Secondary | ICD-10-CM | POA: Diagnosis not present

## 2022-04-27 DIAGNOSIS — M9901 Segmental and somatic dysfunction of cervical region: Secondary | ICD-10-CM | POA: Diagnosis not present

## 2022-04-27 DIAGNOSIS — E538 Deficiency of other specified B group vitamins: Secondary | ICD-10-CM | POA: Diagnosis not present

## 2022-04-27 DIAGNOSIS — M9908 Segmental and somatic dysfunction of rib cage: Secondary | ICD-10-CM

## 2022-04-27 DIAGNOSIS — E559 Vitamin D deficiency, unspecified: Secondary | ICD-10-CM | POA: Diagnosis not present

## 2022-04-27 DIAGNOSIS — M9904 Segmental and somatic dysfunction of sacral region: Secondary | ICD-10-CM

## 2022-04-27 DIAGNOSIS — M9903 Segmental and somatic dysfunction of lumbar region: Secondary | ICD-10-CM | POA: Diagnosis not present

## 2022-04-27 NOTE — Progress Notes (Signed)
[facility] [place] Phone: [phone] Subjective:   [name], am serving as a scribe for Dr. Hulan Saas.  I'm seeing this patient by the request  of:  Patient, No Pcp Per  CC: Back and neck pain follow-up  [id number]  Autumn [name] is a 37 y.o. female coming in with complaint of back and neck pain. OMT 10/05/2022. Patient states that she continues to have intermittent pain in neck and lumbar spine. Earlier this week she bent over to put on her pants and she developed sharp pain in R scapula that radiates into her head with rotation.           Reviewed prior external information including notes and imaging from previsou exam, outside providers and external EMR if available.   As well as notes that were available from care everywhere and other healthcare systems.  Past medical history, social, surgical and family history all reviewed in electronic medical record.  No pertanent information unless stated regarding to the chief complaint.   Past Medical History:  Diagnosis Date   Allergy    Arthritis    GERD (gastroesophageal reflux disease)    Heart palpitations    years ago thought 2/2 stress did not see cardiology was on amitriptyline per pt    History of chicken pox    Hx of migraines     Allergies  Allergen Reactions   Other     Dogs, cats, dust mites and mold       Review of Systems:  No headache, visual changes, nausea, vomiting, diarrhea, constipation, dizziness, abdominal pain, skin rash, fevers, chills, night sweats, weight loss, swollen lymph nodes, body aches, joint swelling, chest pain, shortness of breath, mood changes. POSITIVE muscle aches  Objective  Blood pressure 122/84, pulse 64, height '5\' 4"'$  (1.626 m), SpO2 99 %.   General: No apparent distress alert and oriented x3 mood and affect normal, dressed appropriately.  HEENT: Pupils equal, extraocular movements intact   Respiratory: Patient's speak in full sentences and does not appear short of breath  Cardiovascular: No lower extremity edema, non tender, no erythema  Neck exam does have some mild loss of lordosis.  Significant tightness noted in the right trapezius muscle.  Patient does have limited sidebending to the left and rotation to the right.  Osteopathic findings  C2 flexed rotated and side bent right C6 flexed rotated and side bent right T3 extended rotated and side bent right inhaled rib T9 extended rotated and side bent left L2 flexed rotated and side bent right Sacrum right on right     Assessment and Plan:  Cervical paraspinal muscle spasm This is chronic problem with some exacerbation.  Has been sometime since we have seen patient.  Discussed icing regimen and home exercises, discussed which activities to do and which ones to avoid.  Patient responded extremely well though to osteopathic manipulation.  Follow-up again in 6 to 8 weeks    Nonallopathic problems  Decision today to treat with OMT was based on Physical Exam  After verbal consent patient was treated with HVLA, ME, FPR techniques in cervical, rib, thoracic, lumbar, and sacral  areas  Patient tolerated the procedure well with improvement in symptoms  Patient given exercises, stretches and lifestyle modifications  See medications in patient instructions if given  Patient will follow up in 4-8 weeks     The above documentation has been reviewed and is accurate and complete Olevia Bowens  Tamala Julian, DO         Note: This dictation was prepared with Dragon dictation along with smaller phrase technology. Any transcriptional errors that result from this process are unintentional.

## 2022-04-27 NOTE — Patient Instructions (Signed)
Same old  Stay active See me in 2-3 months

## 2022-04-27 NOTE — Assessment & Plan Note (Signed)
This is chronic problem with some exacerbation.  Has been sometime since we have seen patient.  Discussed icing regimen and home exercises, discussed which activities to do and which ones to avoid.  Patient responded extremely well though to osteopathic manipulation.  Follow-up again in 6 to 8 weeks

## 2022-04-30 ENCOUNTER — Other Ambulatory Visit: Payer: 59

## 2022-05-03 ENCOUNTER — Encounter: Payer: 59 | Admitting: Internal Medicine

## 2022-06-26 NOTE — Progress Notes (Signed)
[name] [address] [place] [address] Phone: [phone] Subjective:   Autumn [name], am serving as a scribe for Dr. Antoine Primas.  I'm seeing this patient by the request  of:  Patient, No Pcp Per  CC: back and neck pain f/u   [id number]  Autumn [name] is a 37 y.o. female coming in with complaint of back and neck pain. OMT 04/27/2022. Patient states doing well. Same per usual. No new concerns.  Discussed with patient about icing regimen and home exercises.  Medications patient has been prescribed: None  Taking:         Reviewed prior external information including notes and imaging from previsou exam, outside providers and external EMR if available.   As well as notes that were available from care everywhere and other healthcare systems.  Past medical history, social, surgical and family history all reviewed in electronic medical record.  No pertanent information unless stated regarding to the chief complaint.   Past Medical History:  Diagnosis Date   Allergy    Arthritis    GERD (gastroesophageal reflux disease)    Heart palpitations    years ago thought 2/2 stress did not see cardiology was on amitriptyline per pt    History of chicken pox    Hx of migraines     Allergies  Allergen Reactions   Other     Dogs, cats, dust mites and mold       Review of Systems:  No headache, visual changes, nausea, vomiting, diarrhea, constipation, dizziness, abdominal pain, skin rash, fevers, chills, night sweats, weight loss, swollen lymph nodes, body aches, joint swelling, chest pain, shortness of breath, mood changes. POSITIVE muscle aches  Objective  Blood pressure 122/84, pulse 71, height 5\' 4"  (1.626 m), weight 134 lb (60.8 kg), SpO2 98 %.   General: No apparent distress alert and oriented x3 mood and affect normal, dressed appropriately.  HEENT: Pupils equal, extraocular movements intact  Respiratory: Patient's speak in  full sentences and does not appear short of breath  Cardiovascular: No lower extremity edema, non tender, no erythema  MSK:  Back does have some mild loss of lordosis.  Neck exam still has some tightness noted but nothing severe.  Tightness noted in the right parascapular area.  Osteopathic findings  C2 flexed rotated and side bent right C6 flexed rotated and side bent right T3 extended rotated and side bent right inhaled rib T7 extended rotated and side bent left T9 extended rotated and side bent left L3 flexed rotated and side bent right Sacrum right on right    Assessment and Plan:  Cervical paraspinal muscle spasm Patient is doing significantly better overall.  Discussed with patient icing regimen and home exercises.  Discussed which activities to be initial instability.  Increase activity slowly.  Follow-up with me again in 8-12 weeks.    Nonallopathic problems  Decision today to treat with OMT was based on Physical Exam  After verbal consent patient was treated with HVLA, ME, FPR techniques in cervical, rib, thoracic, lumbar, and sacral  areas  Patient tolerated the procedure well with improvement in symptoms  Patient given exercises, stretches and lifestyle modifications  See medications in patient instructions if given  Patient will follow up in 4-8 weeks    The above documentation has been reviewed and is accurate and complete Autumn Saa, DO          Note: This dictation was prepared with Dragon dictation along  with smaller phrase technology. Any transcriptional errors that result from this process are unintentional.

## 2022-07-09 ENCOUNTER — Ambulatory Visit: Payer: 59 | Admitting: Family Medicine

## 2022-07-09 VITALS — BP 122/84 | HR 71 | Ht 64.0 in | Wt 134.0 lb

## 2022-07-09 DIAGNOSIS — M9908 Segmental and somatic dysfunction of rib cage: Secondary | ICD-10-CM

## 2022-07-09 DIAGNOSIS — M9902 Segmental and somatic dysfunction of thoracic region: Secondary | ICD-10-CM | POA: Diagnosis not present

## 2022-07-09 DIAGNOSIS — M9903 Segmental and somatic dysfunction of lumbar region: Secondary | ICD-10-CM

## 2022-07-09 DIAGNOSIS — M9904 Segmental and somatic dysfunction of sacral region: Secondary | ICD-10-CM | POA: Diagnosis not present

## 2022-07-09 DIAGNOSIS — M62838 Other muscle spasm: Secondary | ICD-10-CM

## 2022-07-09 DIAGNOSIS — M9901 Segmental and somatic dysfunction of cervical region: Secondary | ICD-10-CM | POA: Diagnosis not present

## 2022-07-09 NOTE — Patient Instructions (Signed)
Good to see you! Good luck with swim team See you again in 2-3 months

## 2022-07-09 NOTE — Assessment & Plan Note (Signed)
Patient is doing significantly better overall.  Discussed with patient icing regimen and home exercises.  Discussed which activities to be initial instability.  Increase activity slowly.  Follow-up with me again in 8-12 weeks.

## 2022-07-20 ENCOUNTER — Encounter: Payer: Self-pay | Admitting: *Deleted

## 2022-07-20 ENCOUNTER — Telehealth: Payer: Self-pay | Admitting: *Deleted

## 2022-07-20 NOTE — Telephone Encounter (Signed)
Please place future orders for lab appt.    Pt has TOC appt on Wed 07/25/22  But lab appt scheduled on Mon 07/23/22

## 2022-07-23 ENCOUNTER — Encounter: Payer: Self-pay | Admitting: *Deleted

## 2022-07-23 ENCOUNTER — Other Ambulatory Visit: Payer: 59

## 2022-07-23 NOTE — Telephone Encounter (Signed)
Lab appt cancelled. Left voicemail & sent mychart message

## 2022-07-25 ENCOUNTER — Encounter: Payer: 59 | Admitting: Family Medicine

## 2022-08-15 DIAGNOSIS — D2271 Melanocytic nevi of right lower limb, including hip: Secondary | ICD-10-CM | POA: Diagnosis not present

## 2022-08-15 DIAGNOSIS — L821 Other seborrheic keratosis: Secondary | ICD-10-CM | POA: Diagnosis not present

## 2022-08-15 DIAGNOSIS — D2261 Melanocytic nevi of right upper limb, including shoulder: Secondary | ICD-10-CM | POA: Diagnosis not present

## 2022-08-15 DIAGNOSIS — D2272 Melanocytic nevi of left lower limb, including hip: Secondary | ICD-10-CM | POA: Diagnosis not present

## 2022-08-15 DIAGNOSIS — D2262 Melanocytic nevi of left upper limb, including shoulder: Secondary | ICD-10-CM | POA: Diagnosis not present

## 2022-08-15 DIAGNOSIS — D225 Melanocytic nevi of trunk: Secondary | ICD-10-CM | POA: Diagnosis not present

## 2022-09-05 ENCOUNTER — Encounter: Payer: Self-pay | Admitting: Family Medicine

## 2022-09-05 DIAGNOSIS — G43909 Migraine, unspecified, not intractable, without status migrainosus: Secondary | ICD-10-CM

## 2022-09-06 MED ORDER — BUTALBITAL-APAP-CAFFEINE 50-300-40 MG PO CAPS: 1.0000 | ORAL_CAPSULE | ORAL | 3 refills | Status: DC | PRN

## 2022-09-06 MED ORDER — SUMATRIPTAN SUCCINATE 100 MG PO TABS: 100.0000 mg | ORAL_TABLET | ORAL | 5 refills | Status: DC | PRN

## 2022-09-06 NOTE — Telephone Encounter (Signed)
Medications pended for approval.  TOC appt is on 09/26/22

## 2022-09-18 NOTE — Progress Notes (Unsigned)
[name] [address] [place] [address] Phone: [phone] Subjective:    I'm seeing this patient by the request  of:  Patient, No Pcp Per  CC: back and neck pain follow up   [id number]  Autumn [name] is a 37 y.o. female coming in with complaint of back and neck pain. OMT 07/09/2022. Patient states he does have some neck pain noted.  Also does have some lower back pain more than usual.  Has been traveling in the state and some bad mattresses that could lead to potentially contributing.  Medications patient has been prescribed: None  Taking:         Reviewed prior external information including notes and imaging from previsou exam, outside providers and external EMR if available.   As well as notes that were available from care everywhere and other healthcare systems.  Past medical history, social, surgical and family history all reviewed in electronic medical record.  No pertanent information unless stated regarding to the chief complaint.   Past Medical History:  Diagnosis Date   Allergy    Arthritis    GERD (gastroesophageal reflux disease)    Heart palpitations    years ago thought 2/2 stress did not see cardiology was on amitriptyline per pt    History of chicken pox    Hx of migraines     Allergies  Allergen Reactions   Other     Dogs, cats, dust mites and mold       Review of Systems:  No headache, visual changes, nausea, vomiting, diarrhea, constipation, dizziness, abdominal pain, skin rash, fevers, chills, night sweats, weight loss, swollen lymph nodes, body aches, joint swelling, chest pain, shortness of breath, mood changes. POSITIVE muscle aches  Objective  Blood pressure 112/76, pulse 66, height 5\' 4"  (1.626 m), weight 136 lb (61.7 kg), SpO2 99%.   General: No apparent distress alert and oriented x3 mood and affect normal, dressed appropriately.  HEENT: Pupils equal, extraocular movements intact   Respiratory: Patient's speak in full sentences and does not appear short of breath  Cardiovascular: No lower extremity edema, non tender, no erythema  Low back does have some loss of lordosis noted.  Some tightness with FABER test right greater than left.  Neck exam still has a lot of tightness noted on the right side.  No radicular symptoms.  Seems to be more of patient's chronic discomfort.  Osteopathic findings  C3 flexed rotated and side bent right C5 flexed rotated and side bent left T3 extended rotated and side bent right inhaled rib T5 extended rotated and side bent left L1 flexed rotated and side bent right Sacrum right on right       Assessment and Plan:  Cervical paraspinal muscle spasm Still having tightness from time to time.  This seems to be right greater than left.  Will continue to work on [employer] cabin [employer].  Increase activity slowly.  Discussed posture and ergonomics.  Increase activity slowly otherwise.  Follow-up with me again 6 to 8 weeks    Nonallopathic problems  Decision today to treat with OMT was based on Physical Exam  After verbal consent patient was treated with HVLA, ME, FPR techniques in cervical, rib, thoracic, lumbar, and sacral  areas  Patient tolerated the procedure well with improvement in symptoms  Patient given exercises, stretches and lifestyle modifications  See medications in patient instructions if given  Patient will follow up in 4-8 weeks     The  above documentation has been reviewed and is accurate and complete Judi Saa, DO         Note: This dictation was prepared with Dragon dictation along with smaller phrase technology. Any transcriptional errors that result from this process are unintentional.

## 2022-09-19 ENCOUNTER — Ambulatory Visit: Payer: 59 | Admitting: Family Medicine

## 2022-09-19 ENCOUNTER — Encounter: Payer: Self-pay | Admitting: Family Medicine

## 2022-09-19 VITALS — BP 112/76 | HR 66 | Ht 64.0 in | Wt 136.0 lb

## 2022-09-19 DIAGNOSIS — M62838 Other muscle spasm: Secondary | ICD-10-CM | POA: Diagnosis not present

## 2022-09-19 DIAGNOSIS — M9904 Segmental and somatic dysfunction of sacral region: Secondary | ICD-10-CM

## 2022-09-19 DIAGNOSIS — M9903 Segmental and somatic dysfunction of lumbar region: Secondary | ICD-10-CM

## 2022-09-19 DIAGNOSIS — M9908 Segmental and somatic dysfunction of rib cage: Secondary | ICD-10-CM

## 2022-09-19 DIAGNOSIS — M9902 Segmental and somatic dysfunction of thoracic region: Secondary | ICD-10-CM

## 2022-09-19 DIAGNOSIS — M9901 Segmental and somatic dysfunction of cervical region: Secondary | ICD-10-CM | POA: Diagnosis not present

## 2022-09-19 NOTE — Assessment & Plan Note (Signed)
Still having tightness from time to time.  This seems to be right greater than left.  Will continue to work on [employer] cabin [employer].  Increase activity slowly.  Discussed posture and ergonomics.  Increase activity slowly otherwise.  Follow-up with me again 6 to 8 weeks

## 2022-09-19 NOTE — Patient Instructions (Signed)
Good to see you! Enjoy the rest of the summer Continue to stay active See you again in 2 months

## 2022-09-26 ENCOUNTER — Ambulatory Visit: Payer: 59 | Admitting: Family Medicine

## 2022-09-26 ENCOUNTER — Encounter: Payer: Self-pay | Admitting: Family Medicine

## 2022-09-26 VITALS — BP 106/66 | HR 80 | Temp 98.0°F | Resp 16 | Ht 64.0 in | Wt 134.0 lb

## 2022-09-26 DIAGNOSIS — G43719 Chronic migraine without aura, intractable, without status migrainosus: Secondary | ICD-10-CM | POA: Diagnosis not present

## 2022-09-26 NOTE — Patient Instructions (Addendum)
It was a pleasure meeting you today. Thank you for allowing me to take part in your health care.  Our goals for today as we discussed include:  PAP is due this year.  Please schedule appointment at your earliest convenience  Annual in 1 year   If you have any questions or concerns, please do not hesitate to call the office at [phone].  I look forward to our next visit and until then take care and stay safe.  Regards,   Dana Allan, MD   [facility]

## 2022-09-26 NOTE — Progress Notes (Signed)
SUBJECTIVE:   Chief Complaint  Patient presents with   Establish Care   HPI Presents to clinic to transfer care  No acute concerns today  Migraine headaches Controlled with  Fioricet, Imitrex, Phenergan and Zofran as needed.  Had previously tried [employer]	[place] and worked well. Unable to continue due to insurance not covering.  Follows with Neurology at [facility].  Chronic neck/back pain Follows with Sports medicine, treated with OMT.     PERTINENT PMH / PSH: Cervicogenic headache Migraine Chronic neck/back pain  OBJECTIVE:  BP 106/66   Pulse 80   Temp 98 F (36.7 C)   Resp 16   Ht 5\' 4"  (1.626 m)   Wt 134 lb (60.8 kg)   LMP 09/21/2022 (Exact Date)   SpO2 98%   BMI 23.00 kg/m    Physical Exam Vitals reviewed.  Constitutional:      General: She is not in acute distress.    Appearance: She is not ill-appearing.  HENT:     Head: Normocephalic.     Right Ear: Tympanic membrane, ear canal and external ear normal.     Left Ear: Tympanic membrane, ear canal and external ear normal.     Nose: Nose normal.     Mouth/Throat:     Mouth: Mucous membranes are moist.  Eyes:     Extraocular Movements: Extraocular movements intact.     Conjunctiva/sclera: Conjunctivae normal.     Pupils: Pupils are equal, round, and reactive to light.  Neck:     Thyroid: No thyromegaly or thyroid tenderness.     Vascular: No carotid bruit.  Cardiovascular:     Rate and Rhythm: Normal rate and regular rhythm.     Pulses: Normal pulses.     Heart sounds: Normal heart sounds.  Pulmonary:     Effort: Pulmonary effort is normal.     Breath sounds: Normal breath sounds.  Abdominal:     General: Bowel sounds are normal. There is no distension.     Palpations: Abdomen is soft.     Tenderness: There is no abdominal tenderness. There is no right CVA tenderness, left CVA tenderness, guarding or rebound.  Musculoskeletal:        General: Normal range of motion.     Cervical back: Normal range of motion.      Right lower leg: No edema.     Left lower leg: No edema.  Lymphadenopathy:     Cervical: No cervical adenopathy.  Skin:    Capillary Refill: Capillary refill takes less than 2 seconds.  Neurological:     General: No focal deficit present.     Mental Status: She is alert and oriented to person, place, and time. Mental status is at baseline.     Motor: No weakness.  Psychiatric:        Mood and Affect: Mood normal.        Behavior: Behavior normal.        Thought Content: Thought content normal.        Judgment: Judgment normal.        09/26/2022    2:10 PM 07/28/2021   10:07 AM 07/06/2021    1:25 PM 04/27/2020    9:41 AM 03/24/2019    9:47 AM  Depression screen PHQ 2/9  Decreased Interest 0 0 0 0 0  Down, Depressed, Hopeless 0 0 0 0 0  PHQ - 2 Score 0 0 0 0 0  Altered sleeping 0   0 0  Tired, decreased  energy 0   0 0  Change in appetite 0   0 0  Feeling bad or failure about yourself  0   0 0  Trouble concentrating 0   0 0  Moving slowly or fidgety/restless 0   0 0  Suicidal thoughts 0   0 0  PHQ-9 Score 0   0 0  Difficult doing work/chores Not difficult at all   Not difficult at all Not difficult at all      09/26/2022    2:11 PM 04/27/2020    9:41 AM 03/24/2019    9:47 AM  GAD 7 : Generalized Anxiety Score  Nervous, Anxious, on Edge 1 0 0  Control/stop worrying 0 0 0  Worry too much - different things 1 0 0  Trouble relaxing 0 0 0  Restless 0 0 0  Easily annoyed or irritable 1 0 0  Afraid - awful might happen 0 0 0  Total GAD 7 Score 3 0 0  Anxiety Difficulty Not difficult at all Not difficult at all Not difficult at all    ASSESSMENT/PLAN:  Intractable chronic migraine without aura and without status migrainosus Assessment & Plan: Managed by Neurology and Sports Medicine Combination of Migraine and cervicogenic Controlled with current medication and OMT Continue to follow up with Neuro and SM for therapy   PAP due 2026. Patient to schedule appointment Obtain  annual labs at that time.  PDMP reviewed  Return in about 1 year (around 09/26/2023), or if symptoms worsen or fail to improve.  Dana Allan, MD

## 2022-10-04 ENCOUNTER — Encounter (INDEPENDENT_AMBULATORY_CARE_PROVIDER_SITE_OTHER): Payer: Self-pay

## 2022-10-13 ENCOUNTER — Encounter: Payer: Self-pay | Admitting: Family Medicine

## 2022-10-13 NOTE — Assessment & Plan Note (Signed)
Managed by Neurology and Sports Medicine Combination of Migraine and cervicogenic Controlled with current medication and OMT Continue to follow up with Neuro and SM for therapy

## 2022-10-29 DIAGNOSIS — G43719 Chronic migraine without aura, intractable, without status migrainosus: Secondary | ICD-10-CM | POA: Diagnosis not present

## 2022-11-14 ENCOUNTER — Ambulatory Visit: Payer: 59 | Admitting: Family Medicine

## 2022-11-20 NOTE — Progress Notes (Deleted)
  [name] [address] [place] [address] Phone: [phone] Subjective:    I'm seeing this patient by the request  of:  Dana Allan, MD  CC:   [id number]  Autumn [name] is a 37 y.o. female coming in with complaint of back and neck pain. OMT 09/18/2022. Patient states   Medications patient has been prescribed: None  Taking:         Reviewed prior external information including notes and imaging from previsou exam, outside providers and external EMR if available.   As well as notes that were available from care everywhere and other healthcare systems.  Past medical history, social, surgical and family history all reviewed in electronic medical record.  No pertanent information unless stated regarding to the chief complaint.   Past Medical History:  Diagnosis Date   Allergy    Arthritis    GERD (gastroesophageal reflux disease)    Heart palpitations    years ago thought 2/2 stress did not see cardiology was on amitriptyline per pt    History of chicken pox    Hx of migraines     Allergies  Allergen Reactions   Other     Dogs, cats, dust mites and mold       Review of Systems:  No headache, visual changes, nausea, vomiting, diarrhea, constipation, dizziness, abdominal pain, skin rash, fevers, chills, night sweats, weight loss, swollen lymph nodes, body aches, joint swelling, chest pain, shortness of breath, mood changes. POSITIVE muscle aches  Objective  There were no vitals taken for this visit.   General: No apparent distress alert and oriented x3 mood and affect normal, dressed appropriately.  HEENT: Pupils equal, extraocular movements intact  Respiratory: Patient's speak in full sentences and does not appear short of breath  Cardiovascular: No lower extremity edema, non tender, no erythema  Gait MSK:  Back   Osteopathic findings  C2 flexed rotated and side bent right C6 flexed rotated and side bent left T3  extended rotated and side bent right inhaled rib T9 extended rotated and side bent left L2 flexed rotated and side bent right Sacrum right on right       Assessment and Plan:  No problem-specific Assessment & Plan notes found for this encounter.    Nonallopathic problems  Decision today to treat with OMT was based on Physical Exam  After verbal consent patient was treated with HVLA, ME, FPR techniques in cervical, rib, thoracic, lumbar, and sacral  areas  Patient tolerated the procedure well with improvement in symptoms  Patient given exercises, stretches and lifestyle modifications  See medications in patient instructions if given  Patient will follow up in 4-8 weeks             Note: This dictation was prepared with Dragon dictation along with smaller phrase technology. Any transcriptional errors that result from this process are unintentional.

## 2022-11-21 ENCOUNTER — Ambulatory Visit: Payer: 59 | Admitting: Family Medicine

## 2022-12-26 NOTE — Progress Notes (Deleted)
  [name] [address] [place] [address] Phone: [phone] Subjective:    I'm seeing this patient by the request  of:  Dana Allan, MD  CC:   [id number]  Autumn [name] is a 37 y.o. female coming in with complaint of back and neck pain. OMT 09/19/2022. Patient states   Medications patient has been prescribed: None  Taking:         Reviewed prior external information including notes and imaging from previsou exam, outside providers and external EMR if available.   As well as notes that were available from care everywhere and other healthcare systems.  Past medical history, social, surgical and family history all reviewed in electronic medical record.  No pertanent information unless stated regarding to the chief complaint.   Past Medical History:  Diagnosis Date   Allergy    Arthritis    GERD (gastroesophageal reflux disease)    Heart palpitations    years ago thought 2/2 stress did not see cardiology was on amitriptyline per pt    History of chicken pox    Hx of migraines     Allergies  Allergen Reactions   Other     Dogs, cats, dust mites and mold       Review of Systems:  No headache, visual changes, nausea, vomiting, diarrhea, constipation, dizziness, abdominal pain, skin rash, fevers, chills, night sweats, weight loss, swollen lymph nodes, body aches, joint swelling, chest pain, shortness of breath, mood changes. POSITIVE muscle aches  Objective  There were no vitals taken for this visit.   General: No apparent distress alert and oriented x3 mood and affect normal, dressed appropriately.  HEENT: Pupils equal, extraocular movements intact  Respiratory: Patient's speak in full sentences and does not appear short of breath  Cardiovascular: No lower extremity edema, non tender, no erythema  Gait MSK:  Back   Osteopathic findings  C2 flexed rotated and side bent right C6 flexed rotated and side bent left T3  extended rotated and side bent right inhaled rib T9 extended rotated and side bent left L2 flexed rotated and side bent right Sacrum right on right       Assessment and Plan:  No problem-specific Assessment & Plan notes found for this encounter.    Nonallopathic problems  Decision today to treat with OMT was based on Physical Exam  After verbal consent patient was treated with HVLA, ME, FPR techniques in cervical, rib, thoracic, lumbar, and sacral  areas  Patient tolerated the procedure well with improvement in symptoms  Patient given exercises, stretches and lifestyle modifications  See medications in patient instructions if given  Patient will follow up in 4-8 weeks             Note: This dictation was prepared with Dragon dictation along with smaller phrase technology. Any transcriptional errors that result from this process are unintentional.

## 2022-12-27 ENCOUNTER — Ambulatory Visit: Payer: 59 | Admitting: Family Medicine

## 2023-02-04 DIAGNOSIS — Z6823 Body mass index (BMI) 23.0-23.9, adult: Secondary | ICD-10-CM | POA: Diagnosis not present

## 2023-02-04 DIAGNOSIS — J0120 Acute ethmoidal sinusitis, unspecified: Secondary | ICD-10-CM | POA: Diagnosis not present

## 2023-05-21 ENCOUNTER — Telehealth: Payer: Self-pay | Admitting: Family Medicine

## 2023-05-21 NOTE — Telephone Encounter (Signed)
 Lm;  Dr Clent Ridges is leaving the practice in June. If you wish to continue care at this office, please call and schedule a transfer of care to one of the following providers: Dr Charlann Lange, MD,  Darleen Crocker or Kara Dies, NP.  E2C2 please schedule TOC

## 2023-09-19 ENCOUNTER — Other Ambulatory Visit: Payer: Self-pay

## 2023-09-19 ENCOUNTER — Other Ambulatory Visit: Payer: Self-pay | Admitting: Family Medicine

## 2023-09-19 DIAGNOSIS — G43909 Migraine, unspecified, not intractable, without status migrainosus: Secondary | ICD-10-CM

## 2023-09-19 DIAGNOSIS — R110 Nausea: Secondary | ICD-10-CM

## 2023-09-19 MED ORDER — BUTALBITAL-APAP-CAFFEINE 50-300-40 MG PO CAPS: 1.0000 | ORAL_CAPSULE | ORAL | 3 refills | Status: AC | PRN

## 2023-09-19 MED ORDER — SUMATRIPTAN SUCCINATE 100 MG PO TABS: 100.0000 mg | ORAL_TABLET | ORAL | 5 refills | Status: AC | PRN

## 2023-09-19 NOTE — Telephone Encounter (Unsigned)
 Copied from CRM #[id number]. Topic: Clinical - Medication Refill >> Sep 19, 2023 10:02 AM Chiquita SQUIBB wrote: Medication:   ondansetron  ondansetron  (ZOFRAN ) 4 MG tablet  Has the patient contacted their pharmacy? Yes (Agent: If no, request that the patient contact the pharmacy for the refill. If patient does not wish to contact the pharmacy document the reason why and proceed with request.) (Agent: If yes, when and what did the pharmacy advise?)  This is the patient's preferred pharmacy:   [id number] [place], [place] - [address] [place] [address] Phone: [phone] Fax: [phone]  Is this the correct pharmacy for this prescription? Yes If no, delete pharmacy and type the correct one.   Has the prescription been filled recently? No  Is the patient out of the medication? Yes  Has the patient been seen for an appointment in the last year OR does the patient have an upcoming appointment? Yes  Can we respond through MyChart? No  Agent: Please be advised that Rx refills may take up to 3 business days. We ask that you follow-up with your pharmacy.

## 2023-09-19 NOTE — Telephone Encounter (Signed)
 Copied from CRM #[id number]. Topic: Clinical - Prescription Issue >> Sep 19, 2023 10:47 AM Berneda FALCON wrote: Reason for CRM: Pt states the medication was transferred by [facility] but they dont know where they transferred it to or why it was transferred. Pt [facility].  Medication:SUMAtriptan  (IMITREX ) 100 MG tablet  Pharmacy:[id number] [place], [place] - [address] [place] [address] Phone: [phone] Fax: [phone] Hours: Not open 24 hours  Patient callback is [phone]

## 2023-11-06 ENCOUNTER — Encounter: Payer: 59 | Admitting: Family Medicine
